# Patient Record
Sex: Female | Born: 1980 | Race: White | Hispanic: No | Marital: Married | Smoking: Former smoker
Health system: Southern US, Community
[De-identification: ages and names within clinical notes are randomized; demographics above are authoritative.]

## PROBLEM LIST (undated history)

## (undated) DIAGNOSIS — J45909 Unspecified asthma, uncomplicated: Secondary | ICD-10-CM

## (undated) DIAGNOSIS — F419 Anxiety disorder, unspecified: Secondary | ICD-10-CM

## (undated) DIAGNOSIS — K2100 Gastro-esophageal reflux disease with esophagitis, without bleeding: Secondary | ICD-10-CM

## (undated) DIAGNOSIS — Z973 Presence of spectacles and contact lenses: Secondary | ICD-10-CM

## (undated) DIAGNOSIS — K219 Gastro-esophageal reflux disease without esophagitis: Secondary | ICD-10-CM

## (undated) DIAGNOSIS — T753XXA Motion sickness, initial encounter: Secondary | ICD-10-CM

---

## 2013-02-25 DIAGNOSIS — G4700 Insomnia, unspecified: Secondary | ICD-10-CM | POA: Insufficient documentation

## 2013-02-25 DIAGNOSIS — J45909 Unspecified asthma, uncomplicated: Secondary | ICD-10-CM | POA: Insufficient documentation

## 2013-04-24 DIAGNOSIS — F419 Anxiety disorder, unspecified: Secondary | ICD-10-CM | POA: Insufficient documentation

## 2013-08-24 DIAGNOSIS — Z0000 Encounter for general adult medical examination without abnormal findings: Secondary | ICD-10-CM | POA: Insufficient documentation

## 2014-04-09 DIAGNOSIS — J309 Allergic rhinitis, unspecified: Secondary | ICD-10-CM | POA: Insufficient documentation

## 2016-05-10 DIAGNOSIS — J4540 Moderate persistent asthma, uncomplicated: Secondary | ICD-10-CM | POA: Insufficient documentation

## 2016-05-10 DIAGNOSIS — J4530 Mild persistent asthma, uncomplicated: Secondary | ICD-10-CM | POA: Insufficient documentation

## 2018-07-27 ENCOUNTER — Other Ambulatory Visit: Payer: Self-pay

## 2018-07-27 ENCOUNTER — Encounter: Payer: Self-pay | Admitting: Emergency Medicine

## 2018-07-27 ENCOUNTER — Ambulatory Visit
Admission: EM | Admit: 2018-07-27 | Discharge: 2018-07-27 | Disposition: A | Discharge status: Home / Self Care | Payer: Self-pay | Attending: Family Medicine | Admitting: Family Medicine

## 2018-07-27 DIAGNOSIS — J4521 Mild intermittent asthma with (acute) exacerbation: Secondary | ICD-10-CM

## 2018-07-27 HISTORY — DX: Gastro-esophageal reflux disease with esophagitis, without bleeding: K21.00

## 2018-07-27 HISTORY — DX: Gastro-esophageal reflux disease with esophagitis: K21.0

## 2018-07-27 HISTORY — DX: Unspecified asthma, uncomplicated: J45.909

## 2018-07-27 HISTORY — DX: Anxiety disorder, unspecified: F41.9

## 2018-07-27 MED ORDER — PREDNISONE 20 MG PO TABS: ORAL_TABLET | ORAL | 0 refills | Status: DC

## 2018-07-27 NOTE — Discharge Instructions (Addendum)
Take medication as prescribed. Drink plenty of fluids. Follow up with pulmonologist as discussed.   Follow up with your primary care physician this week as needed. Return to Urgent care for new or worsening concerns.

## 2018-07-27 NOTE — ED Provider Notes (Signed)
[facility] ____________________________________________  Time seen: Approximately 11:54 AM  I have reviewed the triage vital signs and the nursing notes.   HISTORY  Chief Complaint Asthma   HPI Susan Payne is a 36 y.o. female history of asthma, presenting for evaluation of asthma exacerbation present for last 2 days.  Patient reports she did have a recent cold, but reports she is almost over it and unsure if this is her trigger.  States may be a coincidence or a trigger that she is currently at the end of her Symbicort which may have also been a trigger last month, unsure if dose inhaled different at end.  Was treated 1 months ago with prednisone Dosepak by her pulmonologist for asthma exacerbation.  Patient reports since Wednesday having intermittent chest tightness as well as wheezing with shortness of breath consistent with her asthma.  Denies any chest pain.  States shortness of breath and wheezing same as previous asthma exacerbations.  States nasal congestion and cough has since resolved from recent cold symptoms.  No fevers.  No hemoptysis.  No recent travel.  Denies lower extremity swelling.  Denies current pregnancy.  Has continue with her albuterol inhaler and intermittent nebulizer treatments.  Denies other aggravating alleviating factors.  Reports otherwise feels well.   Patient's last menstrual period was 07/06/2018 (exact date). IUD. Denies pregnancy.     Past Medical History:  Diagnosis Date  . Anxiety   . Asthma   . Reflux esophagitis     There are no active problems to display for this patient.   Past Surgical History:  Procedure Laterality Date  . CESAREAN SECTION       No current facility-administered medications for this encounter.   Current Outpatient Medications:  .  albuterol (PROVENTIL) (2.5 MG/3ML) 0.083% nebulizer solution, Inhale into the lungs., Disp: , Rfl:  .  albuterol (VENTOLIN HFA) 108 (90 Base) MCG/ACT inhaler, INHALE 2  INHALATIONS INTO THE LUNGS EVERY 6 (SIX) HOURS AS NEEDED FOR WHEEZING., Disp: , Rfl:  .  budesonide-formoterol (SYMBICORT) 160-4.5 MCG/ACT inhaler, INHALE 2 INHALATIONS INTO THE LUNGS 2 (TWO) TIMES DAILY, Disp: , Rfl:  .  Cholecalciferol (VITAMIN D3) 5000 units CAPS, Take by mouth., Disp: , Rfl:  .  citalopram (CELEXA) 10 MG tablet, Take by mouth., Disp: , Rfl:  .  loratadine (CLARITIN) 10 MG tablet, Take by mouth., Disp: , Rfl:  .  LORazepam (ATIVAN) 0.5 MG tablet, Take by mouth., Disp: , Rfl:  .  montelukast (SINGULAIR) 10 MG tablet, TAKE 1 TABLET BY MOUTH EVERY DAY IN THE EVENING, Disp: , Rfl:  .  Nebulizers (VIOS LC SPRINT DELUXE) MISC, Use nebulizer q 4 hours as needed.  Please give with hand held mouth device.  Not mask, Disp: , Rfl:  .  omeprazole (PRILOSEC OTC) 20 MG tablet, Take by mouth., Disp: , Rfl:  .  predniSONE (DELTASONE) 20 MG tablet, Take 2 tablets 40mg  daily orally for 3 days, then 1 tablet 20 mg daily for 2 days., Disp: 8 tablet, Rfl: 0  Allergies Patient has no known allergies.  History reviewed. No pertinent family history.  Social History Social History   Tobacco Use  . Smoking status: Former Games developer  . Smokeless tobacco: Never Used  Substance Use Topics  . Alcohol use: Yes  . Drug use: Never    Review of Systems Constitutional: No fever ENT: No sore throat. Cardiovascular: Denies chest pain. Respiratory: As above.  Gastrointestinal: No abdominal pain. Skin: Negative for rash.  ____________________________________________  PHYSICAL EXAM:  VITAL SIGNS: ED Triage Vitals [07/27/18 1128]  Enc Vitals Group     BP      Pulse      Resp      Temp      Temp src      SpO2      Weight 115 lb (52.2 kg)     Height 5\' 4"  (1.626 m)     Head Circumference      Peak Flow      Pain Score 0     Pain Loc      Pain Edu?      Excl. in GC?    Vitals:   07/27/18 1128 07/27/18 1132  BP:  122/82  Pulse:  74  Resp:  18  Temp:  98.2 F (36.8 C)  TempSrc:   Oral  SpO2:  100%  Weight: 115 lb (52.2 kg)   Height: 5\' 4"  (1.626 m)      Constitutional: Alert and oriented. Well appearing and in no acute distress. Eyes: Conjunctivae are normal.  Head: Atraumatic. No sinus tenderness to palpation. No swelling. No erythema.  Ears: no erythema, normal TMs bilaterally.   Nose:No nasal congestion  Mouth/Throat: Mucous membranes are moist. No pharyngeal erythema. No tonsillar swelling or exudate.  Neck: No stridor.  No cervical spine tenderness to palpation. Hematological/Lymphatic/Immunilogical: No cervical lymphadenopathy. Cardiovascular: Normal rate, regular rhythm. Grossly normal heart sounds.  Good peripheral circulation. Respiratory: Normal respiratory effort.  No retractions.  No rhonchi or rales.  Minimal scattered bilateral wheezes noted.  Good air movement.  Speaks in complete sentences. Musculoskeletal: Ambulatory with steady gait.  No lower extremity edema noted bilaterally. Neurologic:  Normal speech and language. No gait instability. Skin:  Skin appears warm, dry and intact. No rash noted. Psychiatric: Mood and affect are normal. Speech and behavior are normal.  ___________________________________________   LABS (all labs ordered are listed, but only abnormal results are displayed)  Labs Reviewed - No data to display ____________________________________________   PROCEDURES Procedures   INITIAL IMPRESSION / ASSESSMENT AND PLAN / ED COURSE  Pertinent labs & imaging results that were available during my care of the patient were reviewed by me and considered in my medical decision making (see chart for details).  Well-appearing patient.  No acute distress.  Suspect mild asthma exacerbation.  Continue with current home treatments and will Rx 5-day course prednisone.  Encouraged follow-up with pulmonologist.  Encourage rest, fluids, supportive care.Discussed indication, risks and benefits of medications with patient.  Discussed  follow up with Primary care physician this week as needed. Discussed follow up and return parameters including no resolution or any worsening concerns. Patient verbalized understanding and agreed to plan.   ____________________________________________   FINAL CLINICAL IMPRESSION(S) / ED DIAGNOSES  Final diagnoses:  Exacerbation of intermittent asthma, unspecified asthma severity     ED Discharge Orders         Ordered    predniSONE (DELTASONE) 20 MG tablet     07/27/18 1158           Note: This dictation was prepared with Dragon dictation along with smaller phrase technology. Any transcriptional errors that result from this process are unintentional.         Renford Dills, NP 07/27/18 1216

## 2018-07-27 NOTE — ED Triage Notes (Signed)
Patient states she is having an asthma flare that started 2 days ago. Patient stated she is currently taking Singulair, Symbicort, Claritin and Albuterol nebulizer.

## 2018-10-26 ENCOUNTER — Encounter: Payer: Self-pay | Admitting: *Deleted

## 2018-12-29 ENCOUNTER — Ambulatory Visit
Admission: EM | Admit: 2018-12-29 | Discharge: 2018-12-29 | Disposition: A | Discharge status: Home / Self Care | Payer: BLUE CROSS/BLUE SHIELD | Attending: Emergency Medicine | Admitting: Emergency Medicine

## 2018-12-29 ENCOUNTER — Other Ambulatory Visit: Payer: Self-pay

## 2018-12-29 ENCOUNTER — Encounter: Payer: Self-pay | Admitting: Gynecology

## 2018-12-29 DIAGNOSIS — J45901 Unspecified asthma with (acute) exacerbation: Secondary | ICD-10-CM | POA: Diagnosis not present

## 2018-12-29 DIAGNOSIS — Z87891 Personal history of nicotine dependence: Secondary | ICD-10-CM

## 2018-12-29 MED ORDER — PREDNISONE 10 MG (21) PO TBPK: ORAL_TABLET | ORAL | 0 refills | Status: DC

## 2018-12-29 MED ORDER — IPRATROPIUM-ALBUTEROL 0.5-2.5 (3) MG/3ML IN SOLN
3.0000 mL | Freq: Once | RESPIRATORY_TRACT | Status: AC
  Administered 2018-12-29: 3 mL via RESPIRATORY_TRACT

## 2018-12-29 MED ORDER — AEROCHAMBER PLUS MISC: 2 refills | Status: DC

## 2018-12-29 MED ORDER — IPRATROPIUM BROMIDE 0.02 % IN SOLN: 0.5000 mg | Freq: Two times a day (BID) | RESPIRATORY_TRACT | 0 refills | Status: DC

## 2018-12-29 NOTE — Discharge Instructions (Addendum)
2 puffs from your albuterol inhaler or you may use an albuterol nebulizer every 4 hours for the next 2 days, then every 6 hours for 2 days after that, then as needed.  You can use the ipratropium twice a day with the albuterol nebulizer.  Finish the prednisone unless a healthcare provider tells you to stop.  Make sure you use a spacer when you use your inhalers.  Keep a log of your peak flows, measured 3 times twice a day, write this down.  The number should be going up.

## 2018-12-29 NOTE — ED Triage Notes (Signed)
Patient c/o asthma flare up x couple weeks ago. Per patient using her nebulizer but not helping.

## 2018-12-29 NOTE — ED Provider Notes (Signed)
HPI  SUBJECTIVE:  Susan Payne is a 38 y.o. female who presents with an asthma flare.  She reports wheezing, chest tightness, shortness of breath, dyspnea on exertion, waking up at night coughing and short of breath.  She states that this was triggered by exposure to dust while cleaning yesterday.  She also temporarily decreased her GERD medication which has made it worse.  She has since returned to her previous dosing PPI/H2.  States that her symptoms are identical to previous asthma flares.  She has been doing albuterol nebulizers every 4 hours and started herself on prednisone yesterday with improvement in her symptoms.  She has also started a probiotic, and is taking Mucinex.  Symptoms are worse with being around dust or her cats to which she is allergic.  She denies fevers, calf pain, swelling, hemoptysis, immobilization, surgery in the past 4 weeks, exogenous estrogen.  She denies recent URI/viral/flulike symptoms.  No chest pain.  She gave herself an albuterol nebulizer immediately prior to arrival.  She has a past medical history of asthma on Singulair and Symbicort and is compliant with this.  She also has history of GERD, anxiety.  Noted she is a former smoker.  No history of DVT, PE, cancer, diabetes, hypertension.  LMP: 2/18.  Denies the possibility of being pregnant.  PMD: Andrey Cota, FNP .  Pulmonology: None.   Past Medical History:  Diagnosis Date  . Anxiety   . Asthma   . Reflux esophagitis     Past Surgical History:  Procedure Laterality Date  . CESAREAN SECTION      History reviewed. No pertinent family history.  Social History   Tobacco Use  . Smoking status: Former Games developer  . Smokeless tobacco: Never Used  Substance Use Topics  . Alcohol use: Yes  . Drug use: Never    No current facility-administered medications for this encounter.   Current Outpatient Medications:  .  albuterol (PROVENTIL) (2.5 MG/3ML) 0.083% nebulizer solution, Inhale into the lungs.,  Disp: , Rfl:  .  budesonide-formoterol (SYMBICORT) 160-4.5 MCG/ACT inhaler, INHALE 2 INHALATIONS INTO THE LUNGS 2 (TWO) TIMES DAILY, Disp: , Rfl:  .  citalopram (CELEXA) 10 MG tablet, Take by mouth., Disp: , Rfl:  .  famotidine (PEPCID) 10 MG tablet, Take 20 mg by mouth 2 (two) times daily., Disp: , Rfl:  .  loratadine (CLARITIN) 10 MG tablet, Take by mouth., Disp: , Rfl:  .  LORazepam (ATIVAN) 0.5 MG tablet, Take by mouth., Disp: , Rfl:  .  omeprazole (PRILOSEC OTC) 20 MG tablet, Take by mouth., Disp: , Rfl:  .  albuterol (VENTOLIN HFA) 108 (90 Base) MCG/ACT inhaler, INHALE 2 INHALATIONS INTO THE LUNGS EVERY 6 (SIX) HOURS AS NEEDED FOR WHEEZING., Disp: , Rfl:  .  ipratropium (ATROVENT) 0.02 % nebulizer solution, Take 2.5 mLs (0.5 mg total) by nebulization 2 (two) times daily., Disp: 75 mL, Rfl: 0 .  montelukast (SINGULAIR) 10 MG tablet, TAKE 1 TABLET BY MOUTH EVERY DAY IN THE EVENING, Disp: , Rfl:  .  Nebulizers (VIOS LC SPRINT DELUXE) MISC, Use nebulizer q 4 hours as needed.  Please give with hand held mouth device.  Not mask, Disp: , Rfl:  .  predniSONE (STERAPRED UNI-PAK 21 TAB) 10 MG (21) TBPK tablet, Dispense 12 day pack. Take as directed with food., Disp: 42 tablet, Rfl: 0 .  Spacer/Aero-Holding Chambers (AEROCHAMBER PLUS) inhaler, Use as instructed, Disp: 1 each, Rfl: 2  No Known Allergies   ROS  As noted  in HPI.   Physical Exam  BP 122/85 (BP Location: Left Arm)   Pulse 80   Temp 98 F (36.7 C) (Oral)   Resp 16   Ht 5\' 4"  (1.626 m)   Wt 56.7 kg   LMP 12/08/2018   SpO2 99%   BMI 21.46 kg/m   Constitutional: Well developed, well nourished, no acute distress Eyes:  EOMI, conjunctiva normal bilaterally HENT: Normocephalic, atraumatic,mucus membranes moist Respiratory: Normal inspiratory effort, lungs clear bilaterally, fair air movement. Cardiovascular: Normal rate regular rhythm, no murmurs, rubs, gallops GI: nondistended skin: No rash, skin intact Musculoskeletal:  Calves symmetric, nontender, no palpable cord, no edema. Neurologic: Alert & oriented x 3, no focal neuro deficits Psychiatric: Speech and behavior appropriate   ED Course   Medications  ipratropium-albuterol (DUONEB) 0.5-2.5 (3) MG/3ML nebulizer solution 3 mL (3 mLs Nebulization Given 12/29/18 1617)    Orders Placed This Encounter  Procedures  . Peak flow    Pre and post tx    Standing Status:   Standing    Number of Occurrences:   1    Order Specific Question:   Pre and post Neb    Answer:   Yes    No results found for this or any previous visit (from the past 24 hour(s)). No results found.  ED Clinical Impression  Asthma with acute exacerbation, unspecified asthma severity, unspecified whether persistent   ED Assessment/Plan Presentation consistent with an asthma exacerbation.  Patient has been very aggressive with the albuterol and states that she is improving somewhat with the steroids however she needs a longer taper of steroids.  Her lungs are clear but she does have prolonged expiratory phase.  She is in no respiratory distress.  Will give her a DuoNeb and reevaluate.  She is PERC negative.  Calculated peak flow 427, patient thinks her best peak flow is 450.  preTreatment: 400/350/400 Posttreatment #1.  420/450/430.  She states that she feels significantly better.  Improved air movement.  Plan to send home with a 12-day prednisone taper as patient states that she does not respond to regular short-term tapers, will send her home with a spacer for her albuterol.  We will refill her ipratropium 0.02% solution twice daily.  We will have her keep a log of peak flows.  She will need to follow-up with [facility] pulmonology or Dr. Mike Craze, pulmonology on-call, for ongoing management.  Do not think that she needs a chest x-ray today.  Discussed MDM, treatment plan, and plan for follow-up with patient. Discussed sn/sx that should prompt return to the ED. patient agrees with  plan.   Meds ordered this encounter  Medications  . ipratropium-albuterol (DUONEB) 0.5-2.5 (3) MG/3ML nebulizer solution 3 mL  . ipratropium (ATROVENT) 0.02 % nebulizer solution    Sig: Take 2.5 mLs (0.5 mg total) by nebulization 2 (two) times daily.    Dispense:  75 mL    Refill:  0  . Spacer/Aero-Holding Chambers (AEROCHAMBER PLUS) inhaler    Sig: Use as instructed    Dispense:  1 each    Refill:  2  . predniSONE (STERAPRED UNI-PAK 21 TAB) 10 MG (21) TBPK tablet    Sig: Dispense 12 day pack. Take as directed with food.    Dispense:  42 tablet    Refill:  0    *This clinic note was created using Scientist, clinical (histocompatibility and immunogenetics). Therefore, there may be occasional mistakes despite careful proofreading.   ?   Domenick Gong, MD 12/29/18 1706

## 2019-01-02 ENCOUNTER — Ambulatory Visit: Payer: Self-pay | Admitting: Gastroenterology

## 2019-02-13 ENCOUNTER — Ambulatory Visit
Admission: RE | Admit: 2019-02-13 | Discharge: 2019-02-13 | Disposition: A | Discharge status: Home / Self Care | Payer: Managed Care, Other (non HMO) | Source: Ambulatory Visit | Attending: Allergy | Admitting: Allergy

## 2019-02-13 ENCOUNTER — Other Ambulatory Visit: Payer: Self-pay | Admitting: Allergy

## 2019-02-13 DIAGNOSIS — J4550 Severe persistent asthma, uncomplicated: Secondary | ICD-10-CM | POA: Diagnosis present

## 2019-05-08 ENCOUNTER — Telehealth: Payer: Self-pay

## 2019-05-08 NOTE — Telephone Encounter (Signed)
Called pt x1 and left vm for her to call back and schedule consult appt. Received faxed referral for asthma.

## 2019-05-20 NOTE — Telephone Encounter (Signed)
Called x2 and left vm for pt to call back and schedule appt.

## 2019-06-20 ENCOUNTER — Ambulatory Visit: Payer: Managed Care, Other (non HMO) | Admitting: Pulmonary Disease

## 2019-06-20 ENCOUNTER — Other Ambulatory Visit
Admission: RE | Admit: 2019-06-20 | Discharge: 2019-06-20 | Disposition: A | Discharge status: Home / Self Care | Payer: Managed Care, Other (non HMO) | Source: Ambulatory Visit | Attending: Pulmonary Disease | Admitting: Pulmonary Disease

## 2019-06-20 ENCOUNTER — Encounter: Payer: Self-pay | Admitting: Pulmonary Disease

## 2019-06-20 ENCOUNTER — Other Ambulatory Visit: Payer: Self-pay

## 2019-06-20 VITALS — BP 140/86 | HR 111 | Temp 98.3°F | Ht 64.0 in | Wt 139.0 lb

## 2019-06-20 DIAGNOSIS — R0602 Shortness of breath: Secondary | ICD-10-CM | POA: Insufficient documentation

## 2019-06-20 DIAGNOSIS — K219 Gastro-esophageal reflux disease without esophagitis: Secondary | ICD-10-CM

## 2019-06-20 DIAGNOSIS — Z7952 Long term (current) use of systemic steroids: Secondary | ICD-10-CM

## 2019-06-20 DIAGNOSIS — J45909 Unspecified asthma, uncomplicated: Secondary | ICD-10-CM

## 2019-06-20 LAB — COMPREHENSIVE METABOLIC PANEL
ALT: 49 U/L — ABNORMAL HIGH (ref 0–44)
AST: 27 U/L (ref 15–41)
Albumin: 4.4 g/dL (ref 3.5–5.0)
Alkaline Phosphatase: 51 U/L (ref 38–126)
Anion gap: 10 (ref 5–15)
BUN: 18 mg/dL (ref 6–20)
CO2: 25 mmol/L (ref 22–32)
Calcium: 9.3 mg/dL (ref 8.9–10.3)
Chloride: 104 mmol/L (ref 98–111)
Creatinine, Ser: 0.73 mg/dL (ref 0.44–1.00)
GFR calc Af Amer: >60 mL/min (ref >60)
GFR calc non Af Amer: >60 mL/min (ref >60)
Glucose, Bld: 126 mg/dL — ABNORMAL HIGH (ref 70–99)
Potassium: 4.1 mmol/L (ref 3.5–5.1)
Sodium: 139 mmol/L (ref 135–145)
Total Bilirubin: 0.7 mg/dL (ref 0.3–1.2)
Total Protein: 7.1 g/dL (ref 6.5–8.1)

## 2019-06-20 MED ORDER — SPIRIVA RESPIMAT 2.5 MCG/ACT IN AERS: 2.0000 | INHALATION_SPRAY | Freq: Every day | RESPIRATORY_TRACT | 6 refills | Status: DC

## 2019-06-20 NOTE — Progress Notes (Signed)
Subjective:    Patient ID: Susan Payne, female    DOB: 1981-08-16, 38 y.o.   MRN: [id number]  HPI Susan Payne is a 38 year old remote former smoker (quit 2012) who presents for a second opinion with regards to poorly controlled asthma.  She is kindly referred by Malachy Moan FNP.  The patient has been having issues with asthma for approximately 19 years.  When first diagnosed she was still smoking 1 pack of cigarettes per day she smoked for a total of 18 years.  She quit in 2012.  Up until last fall she had been doing well with her inhalers and as needed nebulizers and prednisone 2.5 mg twice a day.  In the fall 2019 she developing worsening symptoms requiring frequent increased tapers of prednisone.  On March 15 this year she had a flareup and since then has not been able to taper her prednisone to more than 35 mg/day.  She is currently taking anywhere between 35 to 40 mg/day.  She has noted increased weight and developing of moon facies with this.  She had been following at [facility] and she was started on Xolair in June.  Her initial dose was relatively low and she has recently increased dose to appropriate for IgE level and weight on her fourth dose of Xolair.  She has not noted significant improvement yet.  She notices her wheezing mostly in the upper airway.  She has been noted to have mild obstruction on her PFTs that were performed last the [facility] however the flow volume loop is not available for my review and it is described as "delayed".  She has had allergy evaluation that has set that she is sensitive to dogs cats and dust mites.  She has noted that she had issues with mold in the crawl space of her home and has had a mold remediation company clear these issues for her.  She notes that when she starts decreasing prednisone dose she becomes fatigued and perceives that she wheezes more.  She describes dyspnea with minimal exertion that does not respond to her nebulizers or inhalers.   She does not describe any fevers, chills or sweats.  Today she states that it is a "so-so day for her.  There have been variations of control with pregnancy and also during menses.  Her current regimen includes Symbicort 160/4.5, 2 inhalations twice a day.  Singulair 10 mg daily, DuoNeb as needed via nebulizer she is on Spiriva 1.25 mcg 2 inhalations once daily.  Studies so far include a chest x-ray performed on 13 February 2019 that, on my review, shows  some hyperinflation.  Her past medical history, past surgical history and family history has been reviewed.  They are as noted.  She is married lives with her husband has 1 child she works as an English as a second language teacher.  She is currently working out of the home.  She is observing COVID-19 social distancing and masking measures.  She has 2 dogs indoors and 2 outdoor cats.  She resided in [place] until 2010 when she moved to this area.  Review of Systems  Constitutional: Positive for unexpected weight change (Weight gain related to steroids).  HENT: Positive for postnasal drip and sinus pressure.   Eyes: Negative.   Respiratory: Positive for chest tightness, shortness of breath and wheezing.   Cardiovascular: Positive for palpitations (Related to anxiety).  Gastrointestinal:       Reflux symptoms, controlled with PPI  Endocrine: Negative.   Genitourinary: Negative.  Musculoskeletal: Negative.   Skin: Negative.   Allergic/Immunologic: Positive for environmental allergies.  Neurological: Negative.   Hematological: Bruises/bleeds easily (Easy bruising associated with prednisone).  Psychiatric/Behavioral: The patient is nervous/anxious.   All other systems reviewed and are negative.         Objective:   Physical Exam Vitals signs and nursing note reviewed.  Constitutional:      General: She is not in acute distress.    Appearance: Normal appearance. She is normal weight.  HENT:     Head: Normocephalic and atraumatic.     Comments: Moon  facies    Right Ear: External ear normal.     Left Ear: External ear normal.     Nose:     Comments: Nose/mouth/throat not examined due to masking requirements for COVID 19. Eyes:     General: No scleral icterus.    Conjunctiva/sclera: Conjunctivae normal.     Pupils: Pupils are equal, round, and reactive to light.  Neck:     Musculoskeletal: Neck supple.     Thyroid: No thyromegaly.     Trachea: Trachea and phonation normal.  Cardiovascular:     Rate and Rhythm: Normal rate and regular rhythm.     Pulses: Normal pulses.     Heart sounds: Normal heart sounds.  Pulmonary:     Effort: Pulmonary effort is normal.     Breath sounds: No wheezing or rhonchi.     Comments: Coarse breath sounds with slight delay in expiratory phase.  No active wheezing. Abdominal:     General: There is no distension.     Palpations: Abdomen is soft.  Musculoskeletal: Normal range of motion.     Right lower leg: No edema.     Left lower leg: No edema.  Lymphadenopathy:     Cervical: No cervical adenopathy.  Skin:    General: Skin is warm and dry.     Findings: Bruising present.  Neurological:     General: No focal deficit present.     Mental Status: She is alert and oriented to person, place, and time.  Psychiatric:        Mood and Affect: Mood normal.        Behavior: Behavior normal.     Chest x-ray performed in April 2020: Hyperinflation noted, no overt infiltrates.      Assessment & Plan:   1.  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  She recently was started on Xolair but just has had 1 injection at the appropriate dose.  It may be that this may take a few more doses to be having noticeable effects.  On the other hand she may need a change to a different biologic.  We will review IgE and RAST.  If Xolair is working appropriately one would expect elevated total IgE with decreased free IgE.  Pending these results she may also  need evaluation for potential ABPA.  She will need slow taper of her prednisone given that she has been on higher doses for several months now.  We will repeat PFTs to reassess her respiratory status.  Will also obtain alpha-1 antitrypsin to exclude potential hereditary forms of emphysema that may be mimicking asthma.   2.  Dyspnea/shortness of breath: Her persistent dyspnea is of concern.  She has significant hyperinflation noted on chest x-ray performed in April.  We will increase Spiriva to 2.5 mcg 2 puffs daily to see if this helps with  air trapping.  The concern could be also different pulmonary disease etiology other than asthma.  To further evaluate her dyspnea will obtain a CT scan of the chest to evaluate for potential chronic thromboembolic disease, also obtain 2D echo to exclude potential cardiac etiologies of dyspnea.   3.  Gastroesophageal reflux disease: She was reminded of antireflux measures, continue omeprazole OTC.  We will see the patient in 3 to 4 weeks time.  She is to contact us prior to that time should any new difficulties arise.  Thank you for allowing me to participate in this patient's care  This chart was dictated using voice recognition software/Dragon.  Despite best efforts to proofread, errors can occur which can change the meaning.  Any change was purely unintentional.

## 2019-06-20 NOTE — Patient Instructions (Addendum)
1.  We will schedule blood tests, breathing test, heart test and chest CT for evaluation of your issues.  2.  Were checking an alpha-1 antitrypsin.  3.  Try to decrease your prednisone by 1 mg daily every week.  4.  I have increased your Spiriva to the 2.5 mcg size  2 puffs once daily  5.  We will see you in follow-up in 2 to 3 weeks time.

## 2019-06-22 ENCOUNTER — Ambulatory Visit (INDEPENDENT_AMBULATORY_CARE_PROVIDER_SITE_OTHER)
Admission: RE | Admit: 2019-06-22 | Discharge: 2019-06-22 | Disposition: A | Discharge status: Home / Self Care | Payer: Managed Care, Other (non HMO) | Source: Ambulatory Visit

## 2019-06-22 DIAGNOSIS — Z760 Encounter for issue of repeat prescription: Secondary | ICD-10-CM

## 2019-06-22 DIAGNOSIS — J4550 Severe persistent asthma, uncomplicated: Secondary | ICD-10-CM | POA: Diagnosis not present

## 2019-06-22 LAB — ALLERGENS W/TOTAL IGE AREA 2
Alternaria Alternata IgE: <0.1 kU/L
Aspergillus Fumigatus IgE: <0.1 kU/L
Bermuda Grass IgE: <0.1 kU/L
Cat Dander IgE: 1.07 kU/L — AB
Cedar, Mountain IgE: 0.28 kU/L — AB
Cladosporium Herbarum IgE: <0.1 kU/L
Cockroach, German IgE: <0.1 kU/L
Common Silver Birch IgE: <0.1 kU/L
Cottonwood IgE: <0.1 kU/L
D Farinae IgE: <0.1 kU/L
D Pteronyssinus IgE: <0.1 kU/L
Dog Dander IgE: 0.34 kU/L — AB
Elm, American IgE: <0.1 kU/L
IgE (Immunoglobulin E), Serum: 385 IU/mL (ref 6–495)
Johnson Grass IgE: <0.1 kU/L
Maple/Box Elder IgE: 0.11 kU/L — AB
Mouse Urine IgE: <0.1 kU/L
Oak, White IgE: <0.1 kU/L
Pecan, Hickory IgE: <0.1 kU/L
Penicillium Chrysogen IgE: <0.1 kU/L
Pigweed, Rough IgE: <0.1 kU/L
Ragweed, Short IgE: <0.1 kU/L
Sheep Sorrel IgE Qn: <0.1 kU/L
Timothy Grass IgE: <0.1 kU/L
White Mulberry IgE: <0.1 kU/L

## 2019-06-22 MED ORDER — PREDNISONE 10 MG PO TABS: 40.0000 mg | ORAL_TABLET | Freq: Every day | ORAL | 0 refills | Status: AC

## 2019-06-22 NOTE — Discharge Instructions (Addendum)
Prednisone refilled

## 2019-06-22 NOTE — ED Provider Notes (Signed)
Virtual Visit via Video Note:  Susan Payne  initiated request for Telemedicine visit with [facility] team. I connected with Susan Payne  on 06/22/2019 at 4:29 PM  for a synchronized telemedicine visit using a video enabled HIPPA compliant telemedicine application. I verified that I am speaking with Susan Payne  using two identifiers. Hallie C Wieters, PA-C  was physically located in a [facility] site and Susan Payne was located at a different location.   The limitations of evaluation and management by telemedicine as well as the availability of in-person appointments were discussed. Patient was informed that she  may incur a bill ( including co-pay) for this virtual visit encounter. Susan Payne  expressed understanding and gave verbal consent to proceed with virtual visit.     History of Present Illness:Susan Payne  is a 38 y.o. female presents for evaluation for medication refill.  States that she has resistant asthma and is currently working with 2 pulmonologist in order to control her asthma.  Since March she has been on daily prednisone which has only been the only thing that has stabilized her asthma.  She is currently taking 30 to 40 mg daily.  She is concerned as with the holiday weekend she will run out of her prednisone and her pulmonologist are not open.  She recently started taking Xolair and the hope is to taper off the prednisone and become controlled with Biologics.  She denies worsening or flaring of asthma.  Denies cough, fever or shortness of breath.      Allergies  Allergen Reactions  . Dust Mite Extract Itching and Other (See Comments)    Triggers asthma  Highly allergic to cats and dogs     Past Medical History:  Diagnosis Date  . Anxiety   . Asthma   . Reflux esophagitis      Social History   Tobacco Use  . Smoking status: Former Smoker    Packs/day: 1.00    Years: 18.00    Pack years: 18.00    Types: Cigarettes   Quit date: 2012    Years since quitting: 8.6  . Smokeless tobacco: Never Used  Substance Use Topics  . Alcohol use: Yes  . Drug use: Never        Observations/Objective: Physical Exam  Constitutional: She is oriented to person, place, and time and well-developed, well-nourished, and in no distress. No distress.  HENT:  Head: Normocephalic and atraumatic.  Neck:  Moving neck appropriately  Pulmonary/Chest: Effort normal. No respiratory distress.  Speaking in full sentences, no respiratory distress, no coughing during visit  Neurological: She is alert and oriented to person, place, and time.  Speech clear, face symmetric  Nursing note and vitals reviewed.    Assessment and Plan:    ICD-10-CM   1. Severe persistent asthma without complication  J45.50     Patient with persistent asthma.  Will provide prednisone to hold over until next week when she can follow-up with pulmonology.  Provided 20 tablets of 10 mg.  Continue to take as prescribed by pulmonology.Discussed strict return precautions. Patient verbalized understanding and is agreeable with plan.    Follow Up Instructions:     I discussed the assessment and treatment plan with the patient. The patient was provided an opportunity to ask questions and all were answered. The patient agreed with the plan and demonstrated an understanding of the instructions.   The patient was advised to call back or seek an in-person  evaluation if the symptoms worsen or if the condition fails to improve as anticipated.      Janith Lima, PA-C  06/22/2019 4:29 PM         Debara Pickett C, PA-C 06/22/19 1630

## 2019-06-25 ENCOUNTER — Encounter: Payer: Self-pay | Admitting: Pulmonary Disease

## 2019-06-25 DIAGNOSIS — F32A Depression, unspecified: Secondary | ICD-10-CM | POA: Insufficient documentation

## 2019-06-25 DIAGNOSIS — K219 Gastro-esophageal reflux disease without esophagitis: Secondary | ICD-10-CM | POA: Insufficient documentation

## 2019-06-25 DIAGNOSIS — F329 Major depressive disorder, single episode, unspecified: Secondary | ICD-10-CM | POA: Insufficient documentation

## 2019-07-02 ENCOUNTER — Ambulatory Visit: Payer: Managed Care, Other (non HMO)

## 2019-07-02 ENCOUNTER — Ambulatory Visit: Payer: Managed Care, Other (non HMO) | Admitting: Gastroenterology

## 2019-07-05 ENCOUNTER — Ambulatory Visit
Admission: RE | Admit: 2019-07-05 | Discharge: 2019-07-05 | Disposition: A | Discharge status: Home / Self Care | Payer: Managed Care, Other (non HMO) | Source: Ambulatory Visit | Attending: Pulmonary Disease | Admitting: Pulmonary Disease

## 2019-07-05 ENCOUNTER — Other Ambulatory Visit: Payer: Self-pay

## 2019-07-05 DIAGNOSIS — I081 Rheumatic disorders of both mitral and tricuspid valves: Secondary | ICD-10-CM | POA: Insufficient documentation

## 2019-07-05 DIAGNOSIS — R0602 Shortness of breath: Secondary | ICD-10-CM | POA: Diagnosis present

## 2019-07-05 DIAGNOSIS — F419 Anxiety disorder, unspecified: Secondary | ICD-10-CM | POA: Diagnosis not present

## 2019-07-05 NOTE — Progress Notes (Signed)
*  PRELIMINARY RESULTS* Echocardiogram 2D Echocardiogram has been performed.  Sherrie Sport 07/05/2019, 12:08 PM

## 2019-07-10 ENCOUNTER — Encounter: Payer: Self-pay | Admitting: Pulmonary Disease

## 2019-07-10 ENCOUNTER — Ambulatory Visit: Payer: Managed Care, Other (non HMO) | Admitting: Pulmonary Disease

## 2019-07-10 ENCOUNTER — Other Ambulatory Visit: Payer: Self-pay

## 2019-07-10 VITALS — BP 140/88 | HR 95 | Temp 98.5°F | Ht 64.0 in | Wt 141.8 lb

## 2019-07-10 DIAGNOSIS — J45909 Unspecified asthma, uncomplicated: Secondary | ICD-10-CM | POA: Diagnosis not present

## 2019-07-10 DIAGNOSIS — Z7952 Long term (current) use of systemic steroids: Secondary | ICD-10-CM | POA: Diagnosis not present

## 2019-07-10 DIAGNOSIS — R0602 Shortness of breath: Secondary | ICD-10-CM

## 2019-07-10 DIAGNOSIS — K219 Gastro-esophageal reflux disease without esophagitis: Secondary | ICD-10-CM | POA: Diagnosis not present

## 2019-07-10 MED ORDER — PREDNISONE 10 MG PO TABS: 10.0000 mg | ORAL_TABLET | Freq: Every day | ORAL | 3 refills | Status: DC

## 2019-07-10 MED ORDER — PREDNISONE 1 MG PO TABS: ORAL_TABLET | ORAL | 2 refills | Status: DC

## 2019-07-10 MED ORDER — ZAFIRLUKAST 20 MG PO TABS: 20.0000 mg | ORAL_TABLET | Freq: Two times a day (BID) | ORAL | 3 refills | Status: DC

## 2019-07-10 NOTE — Patient Instructions (Signed)
1.  We switched your montelukast to Accolate 20 mg twice a day  2.  Continue decreasing prednisone by 1 mg every week you are currently at 38 mg/day for the next decrease you should be at 37 mg/day  3.  Your Xolair dose is correct, and has been verified  4.  We will start process to switch to to the [facility] office for [place].  5.  We will see you in follow-up after your breathing test.

## 2019-07-11 NOTE — Progress Notes (Signed)
Subjective:    Patient ID: Susan Payne, female    DOB: Sep 30, 1981, 38 y.o.   MRN: [id number]  HPI Susan Payne is a 37 year old remote former smoker (quit 2012) who presents for a follow-up on a second opinion with regards to poorly controlled asthma. The patient has been having issues with asthma for approximately 19 years.  When first diagnosed she was still smoking 1 pack of cigarettes per day she smoked for a total of 18 years.  She quit in 2012.  Up until last fall she had been doing well with her inhalers and as needed nebulizers and prednisone 2.5 mg twice a day.  In the fall 2019 she developing worsening symptoms requiring frequent increased tapers of prednisone.  On March 15 this year she had a flareup and since then has not been able to taper her prednisone to less than 35 mg/day.  Currently she is on 38 mg/day. She has noted increased weight and developing of moon facies with this.  She had been following at [facility] and she was started on Xolair in June.  Her initial dose was relatively low and she has recently increased dose to appropriate for IgE level and weight on her fourth dose of Xolair.  She is to have a Xolair injection today.  She believes that she may be starting to see some mild improvement on her asthma symptoms with this. She notices her wheezing mostly in the upper airway.  She notes that when she starts decreasing prednisone dose she becomes fatigued and perceives that she wheezes more.  She describes dyspnea with minimal exertion that does not respond to her nebulizers or inhalers.  She does not describe any fevers, chills or sweats.  She does admit that when she decreases prednisone she has now realized that most of what gets her to increase the dose again is not somewhat shortness of breath but anxiety.  She will have to decrease prednisone very slowly by 1 mg at a time.  Since her initial visit she has not had any flareups.  She has decreased her prednisone to as  noted above 38 mg daily.  Her allergy panel showed that she had a total IgE of 385 international units/mL (odd since she is on Xolair and should have free IgE measured).  She is highly allergic to cat dander, somewhat allergic to dog dander and allergic to cedar and maple/[place].  She states that Dr. [place] Callas that [name] has told her she would qualify for "allergy shots" however, she would have to be off of prednisone.  She has recently been evaluated by GI and has been switched to Dexilant.  She notes improvement of her gastroesophageal reflux on this medication.  During her initial visit we increase Spiriva to 2.5 mcg 2 puffs daily, she feels that this is also helping her.  She voices no other complaints.  She was debating whether to continue follow-up with [facility] or transfer her care here I have advised her to think it through however she cannot see to pulmonologist at the same time.      Review of Systems  Constitutional: Positive for unexpected weight change (Weight gain related to steroids).  HENT: Positive for postnasal drip.   Eyes: Negative.   Respiratory: Positive for shortness of breath and wheezing.   Cardiovascular: Palpitations: Related to anxiety.  Gastrointestinal:       Reflux symptoms, controlled with PPI  Endocrine: Negative.   Genitourinary: Negative.   Musculoskeletal: Negative.  Skin: Negative.   Allergic/Immunologic: Positive for environmental allergies.  Neurological: Negative.   Hematological: Bruises/bleeds easily (Easy bruising associated with prednisone).  Psychiatric/Behavioral: The patient is nervous/anxious.   All other systems reviewed and are negative.      Objective:   Physical Exam Vitals signs and nursing note reviewed.  Constitutional:      General: She is not in acute distress.    Appearance: Normal appearance. She is normal weight.  HENT:     Head: Normocephalic and atraumatic.     Comments: Moon facies    Right  Ear: External ear normal.     Left Ear: External ear normal.     Nose:     Comments: Nose/mouth/throat not examined due to masking requirements for COVID 19. Eyes:     General: No scleral icterus.    Conjunctiva/sclera: Conjunctivae normal.     Pupils: Pupils are equal, round, and reactive to light.  Neck:     Musculoskeletal: Neck supple.     Thyroid: No thyromegaly.     Trachea: Trachea and phonation normal.  Cardiovascular:     Rate and Rhythm: Normal rate and regular rhythm.     Pulses: Normal pulses.     Heart sounds: Normal heart sounds.  Pulmonary:     Effort: Pulmonary effort is normal.     Breath sounds: No wheezing or rhonchi.     Comments: No active wheezing. Abdominal:     General: There is no distension.     Palpations: Abdomen is soft.  Musculoskeletal: Normal range of motion.     Right lower leg: No edema.     Left lower leg: No edema.  Lymphadenopathy:     Cervical: No cervical adenopathy.  Skin:    General: Skin is warm and dry.     Findings: Bruising present.  Neurological:     General: No focal deficit present.     Mental Status: She is alert and oriented to person, place, and time.  Psychiatric:        Mood and Affect: Mood normal.        Behavior: Behavior normal.   PFTs are still pending.  Echocardiogram was normal this was performed on 05 July 2019 results were discussed with the patient.        Assessment & Plan:   1.  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  She recently was started on Xolair she will have her third injection on the appropriate dose today.  It may be that this may take a few more doses to be having noticeable effects.  On the other hand she may need a change to a different biologic.  We will review IgE and RAST.  If Xolair is working appropriately one would expect elevated total IgE with decreased free IgE.  Her IgE did not show any change from prior.   Depending on whom she chooses to follow-up with she should be worked up for ABPA.  The patient is going to sort these issues out. She will need slow taper of her prednisone given that she has been on higher doses for several months now.    If these are still pending. Alpha-1 antitrypsin is pending.  We will switch montelukast to Accolate 20 mg twice a day as she has not noticed any improvement with montelukast.    2.  Dyspnea/shortness of breath: Her persistent dyspnea is of concern.  She has significant hyperinflation noted  on chest x-ray performed in April.  She had an increase of Spiriva to 2.5 mcg 2 puffs daily during her last visit and feels that she may be improving somewhat with this. The concern could be also different pulmonary disease etiology other than asthma.  Her insurance company did not approve a CT scan of the chest however I still recommend that this be done.  Patient will determine who she will follow with and we will then set up for CT at that point.  Echocardiogram was normal.  3.  Gastroesophageal reflux disease: She was reminded of antireflux measures, recently was evaluated by GI and switch to Dexilant 60 mg daily.  She notes improvement on her gastroesophageal reflux symptoms on this.  We will see the patient after her PFTs if she wishes to continue follow-up here.  She is to contact us prior to that time should any new difficulties arise.   This chart was dictated using voice recognition software/Dragon.  Despite best efforts to proofread, errors can occur which can change the meaning.  Any change was purely unintentional.

## 2019-07-18 ENCOUNTER — Telehealth: Payer: Self-pay | Admitting: Pulmonary Disease

## 2019-07-18 NOTE — Telephone Encounter (Signed)
Received faxed enrollment forms from our [place] office. These have been refaxed back to [place]. Will await summary of benefits.

## 2019-07-24 ENCOUNTER — Telehealth: Payer: Self-pay | Admitting: Pulmonary Disease

## 2019-07-24 MED ORDER — XOLAIR 150 MG/ML ~~LOC~~ SOSY: 300.0000 mg | PREFILLED_SYRINGE | SUBCUTANEOUS | 12 refills | Status: DC

## 2019-07-24 NOTE — Telephone Encounter (Signed)
Contacted [employer] to follow up on pt's summary of benefits and enrollment status. Was advised that they did not have the pt's enrollment form in their system. Will refax these forms. Will continue to follow up.

## 2019-07-24 NOTE — Telephone Encounter (Signed)
Spoke with pt. She was scheduled for a Xolair injection on 07/25/2019. Advised her that we are still in process of getting her medication approved. She states that she has already been approved and this will be a continuing treatment. Her former pulmonologist started her on this medication and Dr. Patsey Berthold is taking it over. Pt is due for an injection tomorrow, she is going to contact her other doctor to see if they will give her one last injection so that she does not miss a dose. Reports that she is getting her medication from [place]. Pt is going to try to see if she can locate a copy of the approval letter from her insurance from her former physicians office that way we will have that on file. Rx for Xolair 300mg  q2w has been sent to [facility] under Dr. Patsey Berthold. Will continue to follow up with pt.

## 2019-07-24 NOTE — Telephone Encounter (Signed)
Please telephone encounter 07/18/2019.

## 2019-07-25 ENCOUNTER — Ambulatory Visit: Payer: Managed Care, Other (non HMO)

## 2019-07-26 NOTE — Telephone Encounter (Signed)
Called [employer] to see if they received the pt's enrollment form. They did receive the enrollment form but the office's tax ID number was missing so they could not do a summary of benefits. This information has been given to them. They will now run a summary of benefits with the pt's insurance. Will continue to follow up.

## 2019-07-30 ENCOUNTER — Ambulatory Visit: Payer: Managed Care, Other (non HMO) | Admitting: Gastroenterology

## 2019-07-30 ENCOUNTER — Encounter

## 2019-08-01 NOTE — Telephone Encounter (Signed)
Fax received from [place].  Patient has Xolair 150 mg PA approval from 03/13/19 until 09/13/19, PA # [id number]. PA is required for Patient to continue Xolair injections.Called [phone], to initiate new xolair 300mg  PA.  Xolair PA is approved for 300mg  every 2 weeks, starting with her next dose until 09/13/19. New Xolair PA can not be started until current PA is closer to end date.

## 2019-08-02 NOTE — Telephone Encounter (Signed)
Received call from Coralyn Mark, with [place], requesting to know if Patient would be using Specialty Pharmacy or buy and bill. Patient will be using Specialty [facility], [facility]. Xolair 300mg  PA is good through 09/13/19. Will follow next week to set up shipment.

## 2019-08-06 ENCOUNTER — Telehealth: Payer: Self-pay | Admitting: Pulmonary Disease

## 2019-08-06 ENCOUNTER — Ambulatory Visit: Payer: Managed Care, Other (non HMO) | Admitting: Gastroenterology

## 2019-08-06 MED ORDER — PREDNISONE 10 MG PO TABS: ORAL_TABLET | ORAL | 2 refills | Status: DC

## 2019-08-06 NOTE — Telephone Encounter (Signed)
Spoke to pt, who stated that she has ran out prednisone and the pharmacy will not refill Rx, as it was written for 10mg  daily.  Pt stated that she is currently taking 35mg  daily and is tapering down.   Rx has been resent for 3 tablets daily per LG verbally.  Pt is aware and voiced her understanding. Nothing further is needed.

## 2019-08-07 NOTE — Telephone Encounter (Signed)
Called [facility] to set up shipment. Was advised that pt's medication could not be refilled/set up for shipment until 08/11/2019. Will call back next week to set up shipment.

## 2019-08-09 ENCOUNTER — Telehealth: Payer: Self-pay | Admitting: Pulmonary Disease

## 2019-08-09 ENCOUNTER — Other Ambulatory Visit: Payer: Self-pay

## 2019-08-09 NOTE — Telephone Encounter (Signed)
Pt is aware of date/time of covid.  

## 2019-08-09 NOTE — Telephone Encounter (Addendum)
Normal alpha-1 per Dr. Patsey Berthold.  Left message for pt.

## 2019-08-12 NOTE — Telephone Encounter (Signed)
LMTCB

## 2019-08-13 ENCOUNTER — Other Ambulatory Visit
Admission: RE | Admit: 2019-08-13 | Discharge: 2019-08-13 | Disposition: A | Discharge status: Home / Self Care | Payer: Managed Care, Other (non HMO) | Source: Ambulatory Visit | Attending: Pulmonary Disease | Admitting: Pulmonary Disease

## 2019-08-13 DIAGNOSIS — Z01812 Encounter for preprocedural laboratory examination: Secondary | ICD-10-CM | POA: Insufficient documentation

## 2019-08-13 DIAGNOSIS — Z20828 Contact with and (suspected) exposure to other viral communicable diseases: Secondary | ICD-10-CM | POA: Insufficient documentation

## 2019-08-13 LAB — SARS CORONAVIRUS 2 (TAT 6-24 HRS): SARS Coronavirus 2: NEGATIVE

## 2019-08-13 NOTE — Telephone Encounter (Signed)
Xolair Prefilled Syringe Order: 150mg  Prefilled Syringe:  #4 75mg  Prefilled Syringe: N/A Ordered Date: 08/13/2019 Expected date of arrival: 08/16/2019 Ordered by: Desmond Dike, [place]  Specialty Pharmacy: [facility]

## 2019-08-14 ENCOUNTER — Other Ambulatory Visit: Payer: Self-pay

## 2019-08-14 ENCOUNTER — Ambulatory Visit: Payer: Managed Care, Other (non HMO) | Attending: Pulmonary Disease

## 2019-08-14 DIAGNOSIS — R0602 Shortness of breath: Secondary | ICD-10-CM | POA: Insufficient documentation

## 2019-08-14 MED ORDER — ALBUTEROL SULFATE (2.5 MG/3ML) 0.083% IN NEBU
2.5000 mg | INHALATION_SOLUTION | Freq: Once | RESPIRATORY_TRACT | Status: AC
  Administered 2019-08-14: 2.5 mg via RESPIRATORY_TRACT
  Filled 2019-08-14: qty 3

## 2019-08-14 NOTE — Telephone Encounter (Signed)
Call made to patient, confirmed DOB, made aware of Alpha 1 results. Voiced understanding. Nothing further needed at this time.

## 2019-08-19 NOTE — Telephone Encounter (Signed)
Called [facility] to follow up on the pt's shipment as we did not receive it on 08/16/2019. Spoke with Bonnita Nasuti. Was advised that they have not been able to get in touch with the pt about the balance she has on file with them. Shipment can't be sent until the balance is taken care of.  Spoke with the pt. She states that she has already taken care of this balance. Pt is going to call [facility] to see what the issue is. Pt will have [facility] reach out to [place] to set up shipment. While speaking to the pt, she wanted to go ahead and set up her appointment. Pt has been scheduled for her injection on 08/22/2019 at 1430. Per Dr. Mortimer Fries (Dr. Patsey Berthold is unavailable), pt will have to wait 2 hours after getting the injection as this is the first time getting the injection with our office.

## 2019-08-20 NOTE — Telephone Encounter (Signed)
Called [facility] to follow up to make sure that the pt's shipment would be here by 08/22/2019. Spoke with [place]. Was advised that the pt set up shipment to be delivered to [place] on 08/21/2019. Will await shipment.

## 2019-08-21 ENCOUNTER — Encounter: Payer: Self-pay | Admitting: Pulmonary Disease

## 2019-08-21 ENCOUNTER — Other Ambulatory Visit: Payer: Self-pay

## 2019-08-21 ENCOUNTER — Ambulatory Visit: Payer: Managed Care, Other (non HMO) | Admitting: Pulmonary Disease

## 2019-08-21 VITALS — BP 130/80 | HR 98 | Temp 97.6°F | Ht 65.0 in | Wt 145.0 lb

## 2019-08-21 DIAGNOSIS — J45909 Unspecified asthma, uncomplicated: Secondary | ICD-10-CM

## 2019-08-21 DIAGNOSIS — R0602 Shortness of breath: Secondary | ICD-10-CM

## 2019-08-21 DIAGNOSIS — J383 Other diseases of vocal cords: Secondary | ICD-10-CM

## 2019-08-21 DIAGNOSIS — Z7952 Long term (current) use of systemic steroids: Secondary | ICD-10-CM

## 2019-08-21 NOTE — Patient Instructions (Signed)
1.  Continue tapering prednisone as you are doing.  2.  Follow Xolair injections as you are doing.  3.  We will see him in follow-up in 3 to 4 weeks time.  We will schedule you with a nurse practitioner.

## 2019-08-21 NOTE — Progress Notes (Signed)
    Assessment & Plan:  1. 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 Vocal cord dysfunction (Primary)  3. Shortness of breath   Patient Instructions  1.  Continue tapering prednisone  as you are doing.  2.  Follow Xolair  injections as you are doing.  3.  We will see him in follow-up in 3 to 4 weeks time.  We will schedule you with a nurse practitioner.  Please note: late entry documentation due to logistical difficulties during COVID-19 pandemic. This note is filed for information purposes only, and is not intended to be used for billing, nor does it represent the full scope/nature of the visit in question. Please see any associated scanned media linked to date of encounter for additional pertinent information.  Subjective:    HPI: Susan Payne is a 38 y.o. female presenting to the pulmonology clinic on 08/21/2019 with report of: Follow-up (review PFT- currently on 33mg  of prednisone . c/o sob with exertion and at rest and wheezing. )     Outpatient Encounter Medications as of 08/21/2019  Medication Sig   [DISCONTINUED] albuterol  (VENTOLIN  HFA) 108 (90 Base) MCG/ACT inhaler INHALE 2 INHALATIONS INTO THE LUNGS EVERY 6 (SIX) HOURS AS NEEDED FOR WHEEZING.   [DISCONTINUED] budesonide -formoterol  (SYMBICORT ) 160-4.5 MCG/ACT inhaler INHALE 2 INHALATIONS INTO THE LUNGS 2 (TWO) TIMES DAILY   [DISCONTINUED] citalopram (CELEXA) 10 MG tablet Take 30 mg by mouth.    [DISCONTINUED] EPINEPHrine  0.3 mg/0.3 mL IJ SOAJ injection  (Patient not taking: Reported on 09/28/2023)   [DISCONTINUED] famotidine (PEPCID) 10 MG tablet Take 20 mg by mouth 2 (two) times daily.   [DISCONTINUED] ipratropium (ATROVENT ) 0.02 % nebulizer solution Take 2.5 mLs (0.5 mg total) by nebulization 2 (two) times daily.   [DISCONTINUED] loratadine (CLARITIN) 10 MG tablet Take by mouth.   [DISCONTINUED] LORazepam (ATIVAN) 0.5 MG tablet Take by mouth.   [DISCONTINUED] Nebulizers (VIOS LC SPRINT DELUXE) MISC  Use nebulizer q 4 hours as needed.  Please give with hand held mouth device.  Not mask   [DISCONTINUED] predniSONE  (DELTASONE ) 1 MG tablet Take as directed for prednisone  taper (Patient taking differently: 3 mg daily. Take as directed for prednisone  taper)   [DISCONTINUED] predniSONE  (DELTASONE ) 10 MG tablet Take 1 tablet (10 mg total) by mouth daily with breakfast. Take as directed, tapering   [DISCONTINUED] predniSONE  (DELTASONE ) 10 MG tablet Take 3 tablets daily or as directed for taper.   [DISCONTINUED] Spacer/Aero-Holding Chambers (AEROCHAMBER PLUS) inhaler Use as instructed   [DISCONTINUED] Tiotropium Bromide Monohydrate  (SPIRIVA  RESPIMAT) 2.5 MCG/ACT AERS Inhale 2 puffs into the lungs daily.   [DISCONTINUED] XOLAIR  150 MG/ML prefilled syringe Inject 300 mg into the skin every 14 (fourteen) days.   [DISCONTINUED] zafirlukast  (ACCOLATE ) 20 MG tablet Take 1 tablet (20 mg total) by mouth 2 (two) times daily before a meal.   [DISCONTINUED] albuterol  (PROVENTIL ) (2.5 MG/3ML) 0.083% nebulizer solution Inhale into the lungs.   [DISCONTINUED] dexlansoprazole (DEXILANT) 60 MG capsule Take by mouth.   No facility-administered encounter medications on file as of 08/21/2019.      Objective:   Vitals:   08/21/19 1151  BP: 130/80  Pulse: 98  Temp: 97.6 F (36.4 C)  Height: 5' 5 (1.651 m)  Weight: 145 lb (65.8 kg)  SpO2: 96%  TempSrc: Temporal  BMI (Calculated): 24.13     Physical exam documentation is limited by delayed entry of information.

## 2019-08-21 NOTE — Telephone Encounter (Signed)
Xolair Prefilled Syringe Received:  150mg  Prefilled Syringe >> quantity #4, lot # [id number], exp date 04/2020 75mg  Prefilled Syringe >> N/A Medication arrival date: 08/21/2019 Received by: Desmond Dike, [place]

## 2019-08-22 ENCOUNTER — Ambulatory Visit (INDEPENDENT_AMBULATORY_CARE_PROVIDER_SITE_OTHER): Payer: Managed Care, Other (non HMO)

## 2019-08-22 DIAGNOSIS — J4550 Severe persistent asthma, uncomplicated: Secondary | ICD-10-CM

## 2019-08-22 MED ORDER — OMALIZUMAB 150 MG/ML ~~LOC~~ SOSY
300.0000 mg | PREFILLED_SYRINGE | Freq: Once | SUBCUTANEOUS | Status: AC
  Administered 2019-08-22: 300 mg via SUBCUTANEOUS

## 2019-08-22 NOTE — Progress Notes (Signed)
Patient presented to the office today for  Xolair injection. Patient was already on Xolair therapy when she started seeing Dr. Patsey Berthold. Per Dr. Mortimer Fries (Dr. Patsey Berthold was not available), patient is to wait 2 hours after injection as this is her first injection with our office.  Primary Pulmonologist:  Dr. Vernard Gambles Medication name: Xolair Strength: 300mg   Site(s): L and R Arm  Epi pen/Auvi-Q visible during appointment: Yes  Time of injection: 9937  Patient evaluated every 15-20 minutes per protocol x2 hours.  1st check: 1500 Evaluation: No Reactions  2nd check: 1520 Evaluation: No Reactions  3rd check: 1540 Evaluation: No Reactions  4th check: 1600 Evaluation: No Reactions  5th check: 1620 Evaluation: No Reactions  6th check: 1640 Evaluation: No Reactions

## 2019-08-27 ENCOUNTER — Telehealth: Payer: Self-pay | Admitting: Pulmonary Disease

## 2019-08-27 MED ORDER — ZAFIRLUKAST 20 MG PO TABS: 20.0000 mg | ORAL_TABLET | Freq: Two times a day (BID) | ORAL | 3 refills | Status: DC

## 2019-08-27 NOTE — Telephone Encounter (Signed)
Created in error

## 2019-09-05 ENCOUNTER — Other Ambulatory Visit: Payer: Self-pay

## 2019-09-05 ENCOUNTER — Ambulatory Visit (INDEPENDENT_AMBULATORY_CARE_PROVIDER_SITE_OTHER): Payer: Managed Care, Other (non HMO)

## 2019-09-05 DIAGNOSIS — J4550 Severe persistent asthma, uncomplicated: Secondary | ICD-10-CM | POA: Diagnosis not present

## 2019-09-05 MED ORDER — OMALIZUMAB 150 MG/ML ~~LOC~~ SOSY
300.0000 mg | PREFILLED_SYRINGE | Freq: Once | SUBCUTANEOUS | Status: AC
  Administered 2019-09-05: 17:00:00 300 mg via SUBCUTANEOUS

## 2019-09-05 NOTE — Progress Notes (Signed)
Have you been hospitalized within the last 10 days?  No Do you have a fever?  No Do you have a cough?  No Do you have a headache or sore throat? No Do you have your Epi Pen visible and is it within date?  Yes   Patient received second Xolair injections from this office.  Patient stated she started Xolair in 03/2019.   Patient monitored for 15 mins. Xolair injection given 1645, 150mg /ml in right arm.  150mg /ml in left arm.  1700- Patient denies any adverse effects.  No redness or pain at injection sites. Patient scheduled 2 week injection. Nothing further at this time.

## 2019-09-10 ENCOUNTER — Telehealth: Payer: Self-pay | Admitting: Pulmonary Disease

## 2019-09-11 NOTE — Telephone Encounter (Signed)
Xolair Prefilled Syringe Order: 150mg  Prefilled Syringe:  #4 75mg  Prefilled Syringe: N/A Ordered Date: 09/11/19  Expected date of arrival: 09/17/2019 Ordered by: Len Blalock, [place]: [facility]

## 2019-09-16 ENCOUNTER — Encounter: Payer: Self-pay | Admitting: Pulmonary Disease

## 2019-09-16 ENCOUNTER — Ambulatory Visit (INDEPENDENT_AMBULATORY_CARE_PROVIDER_SITE_OTHER): Payer: Managed Care, Other (non HMO) | Admitting: Pulmonary Disease

## 2019-09-16 DIAGNOSIS — K219 Gastro-esophageal reflux disease without esophagitis: Secondary | ICD-10-CM

## 2019-09-16 DIAGNOSIS — J309 Allergic rhinitis, unspecified: Secondary | ICD-10-CM

## 2019-09-16 DIAGNOSIS — R768 Other specified abnormal immunological findings in serum: Secondary | ICD-10-CM | POA: Diagnosis not present

## 2019-09-16 DIAGNOSIS — J4530 Mild persistent asthma, uncomplicated: Secondary | ICD-10-CM | POA: Diagnosis not present

## 2019-09-16 DIAGNOSIS — Z9109 Other allergy status, other than to drugs and biological substances: Secondary | ICD-10-CM | POA: Insufficient documentation

## 2019-09-16 DIAGNOSIS — J383 Other diseases of vocal cords: Secondary | ICD-10-CM | POA: Insufficient documentation

## 2019-09-16 NOTE — Assessment & Plan Note (Addendum)
Plan:  Continue breathing exercises  Referral to [facility] Continue to continue to taper prednisone

## 2019-09-16 NOTE — Progress Notes (Signed)
Virtual Visit via Telephone Note  I connected with Susan Payne on 09/16/19 at  3:30 PM EST by telephone and verified that I am speaking with the correct person using two identifiers.  Location: Patient: Home Provider: Office [facility] - [address] [place], [address], [place], [place] [address]   I discussed the limitations, risks, security and privacy concerns of performing an evaluation and management service by telephone and the availability of in person appointments. I also discussed with the patient that there may be a patient responsible charge related to this service. The patient expressed understanding and agreed to proceed.  Patient consented to consult via telephone: Yes People present and their role in pt care: Pt   History of Present Illness:  38 year old former smoker followed in our office for asthma and vcd  PMH: GERD, anxiety, depression Smoking History: Former Smoker. Quit 2012. 18 pack year smoker.  Maintenance: Symbicort 160, Spiriva Respimat 2.5, Xolair, predisone 26mg   Pt of Dr.   Chief complaint: 3 to 4-week follow-up, last seen on 08/21/2019   38 year old female former smoker followed in our office for asthma.  Patient is managed on Symbicort 160, Spiriva Respimat 2.5 and Xolair.  Patient is currently tapering prednisone down.  She was last seen by Dr. 24 on 08/21/2019.  Patient was started on high dose chronic steroids by Dr. 13/01/2019 and pulmonologist at [facility].  Patient has been working to decrease down on the steroids since been established with our office.  She has also been started on Xolair injections.  She does have blood allergy testing that shows allergies to cat dander, dog dander, maple [place] and [place] IgE.  Largest elevations being to cat dander.  Patient was previously living with 2 cats they have not been transition to be outside cats.  Patient does have 2 dogs that also live with her that are too old to be  transitioned outside.  They have replaced all of the carpets in her house.  They have also replaced the sofa.  She continues to work with allergy asthma Dr. [facility].   Observations/Objective:  06/20/2019-allergy profile-cat dander, dog dander, maple [place], [facility] IgE, elevated, largest elevation cat dander, IgE 385  02/13/2019-chest x-ray-chest radiograph the within normal limits  07/05/2019-echocardiogram-LV ejection fraction 60 to 65%, normal pulmonary artery systolic pressure, global right ventricle is normal systolic function  Social History   Tobacco Use  Smoking Status Former Smoker  . Packs/day: 1.00  . Years: 18.00  . Pack years: 18.00  . Types: Cigarettes  . Quit date: 2012  . Years since quitting: 8.9  Smokeless Tobacco Never Used   Immunization History  Administered Date(s) Administered  . Influenza, Seasonal, Injecte, Preservative Fre 08/23/2013  . Influenza,inj,Quad PF,6+ Mos 07/01/2014  . Influenza-Unspecified 07/01/2014  . Pneumococcal Polysaccharide-23 04/24/2013  . Tdap 05/13/2016   Assessment and Plan:  Vocal cord dysfunction Plan:  Continue breathing exercises  Referral to [facility] Continue to continue to taper prednisone   Elevated IgE level Plan:  Continue Xolair injections  Mild persistent asthma Plan:  Continue Symbicort 160 Continue Spiriva Resp   continue to decrease prednisone as already instructed by Dr. 05/15/2016 Continue Xolair injections Continue nasal saline rinses twice daily Continue work with allergy asthma   Environmental allergies Removed carpets and sofa 2 cats (now outside) 2 dogs inside Established with allergy asthma Dr. Jayme Cloud  Plan:  Continue follow up with allergist  I believe it is okay to  consider restarting immunotherapy in calendar year 2021 as we will likely have you off of chronic steroid use at that point in time Continue nasal saline rinses 2 times daily Continue Xolair  injections    Allergic rhinitis Plan: Continue Xolair injections Continue nasal saline rinses twice daily Continue to avoid known allergen triggers such as cats, dogs, trees Continue to work with allergy asthma Can consider restarting immunotherapy in calendar year 2021 as we will hopefully have you tapered off of prednisone at that point in time  Follow Up Instructions:  Return in about 2 months (around 11/16/2019), or if symptoms worsen or fail to improve, for [facility] - Dr. Patsey Berthold.   I discussed the assessment and treatment plan with the patient. The patient was provided an opportunity to ask questions and all were answered. The patient agreed with the plan and demonstrated an understanding of the instructions.   The patient was advised to call back or seek an in-person evaluation if the symptoms worsen or if the condition fails to improve as anticipated.  I provided 23 minutes of non-face-to-face time during this encounter.   Lauraine Rinne, NP

## 2019-09-16 NOTE — Patient Instructions (Addendum)
You were seen today by Lauraine Rinne, NP  for:   1. Allergic rhinitis, unspecified seasonality, unspecified trigger  2. Mild persistent asthma without complication  3. Gastroesophageal reflux disease, unspecified whether esophagitis present  4. Elevated IgE level  5. Vocal cord dysfunction - Ambulatory referral to ENT  6. Environmental allergies   Continue to taper down your prednisone as instructed  Continue inhalers as instructed Referral to [facility] for vocal cord dysfunction Continue Xolair injections Start nasal saline rinses 2 times daily    We recommend today:  Orders Placed This Encounter  Procedures  . Ambulatory referral to ENT    Referral Priority:   Routine    Referral Type:   Consultation    Referral Reason:   Specialty Services Required    Requested Specialty:   Otolaryngology    Number of Visits Requested:   1   Orders Placed This Encounter  Procedures  . Ambulatory referral to ENT   No orders of the defined types were placed in this encounter.   Follow Up:    Return in about 2 months (around 11/16/2019), or if symptoms worsen or fail to improve, for [facility] - Dr. Patsey Berthold.   Please do your part to reduce the spread of COVID-19:      Reduce your risk of any infection  and COVID19 by using the similar precautions used for avoiding the common cold or flu:  Marland Kitchen Wash your hands often with soap and warm water for at least 20 seconds.  If soap and water are not readily available, use an alcohol-based hand sanitizer with at least 60% alcohol.  . If coughing or sneezing, cover your mouth and nose by coughing or sneezing into the elbow areas of your shirt or coat, into a tissue or into your sleeve (not your hands). Langley Gauss A MASK when in public  . Avoid shaking hands with others and consider head nods or verbal greetings only. . Avoid touching your eyes, nose, or mouth with unwashed hands.  . Avoid close contact with people who are sick. .  Avoid places or events with large numbers of people in one location, like concerts or sporting events. . If you have some symptoms but not all symptoms, continue to monitor at home and seek medical attention if your symptoms worsen. . If you are having a medical emergency, call 911.   [place] / e-Visit: [email or web address]         [facility]: [facility]: [phone]                   [facility]: [phone]     It is flu season:   >>> Best ways to protect herself from the flu: Receive the yearly flu vaccine, practice good hand hygiene washing with soap and also using hand sanitizer when available, eat a nutritious meals, get adequate rest, hydrate appropriately   Please contact the office if your symptoms worsen or you have concerns that you are not improving.   Thank you for choosing [facility] for your healthcare, and for allowing [place] to partner with you on your healthcare journey. I am thankful to be able to provide care to you today.   Wyn Quaker FNP-C

## 2019-09-16 NOTE — Assessment & Plan Note (Signed)
Plan: Continue Xolair injections Continue nasal saline rinses twice daily Continue to avoid known allergen triggers such as cats, dogs, trees Continue to work with allergy asthma Can consider restarting immunotherapy in calendar year 2021 as we will hopefully have you tapered off of prednisone at that point in time

## 2019-09-16 NOTE — Assessment & Plan Note (Signed)
Removed carpets and sofa 2 cats (now outside) 2 dogs inside Established with allergy asthma Dr. Donneta Romberg  Plan:  Continue follow up with allergist  I believe it is okay to consider restarting immunotherapy in calendar year 2021 as we will likely have you off of chronic steroid use at that point in time Continue nasal saline rinses 2 times daily Continue Xolair injections

## 2019-09-16 NOTE — Assessment & Plan Note (Addendum)
Plan:  Continue Symbicort 160 Continue Spiriva Resp   continue to decrease prednisone as already instructed by Dr. Patsey Berthold Continue Xolair injections Continue nasal saline rinses twice daily Continue work with allergy asthma

## 2019-09-16 NOTE — Assessment & Plan Note (Signed)
Plan:  Continue Xolair injections

## 2019-09-18 NOTE — Telephone Encounter (Signed)
Xolair Prefilled Syringe Received:  150mg  Prefilled Syringe >> quantity #4, lot # [id number], exp date 05/2020 75mg  Prefilled Syringe >> N/A Medication arrival date: 09/18/2019 Received by: Desmond Dike, [place]

## 2019-09-19 ENCOUNTER — Ambulatory Visit: Payer: Managed Care, Other (non HMO)

## 2019-09-23 ENCOUNTER — Ambulatory Visit (INDEPENDENT_AMBULATORY_CARE_PROVIDER_SITE_OTHER): Payer: Managed Care, Other (non HMO)

## 2019-09-23 ENCOUNTER — Other Ambulatory Visit: Payer: Self-pay

## 2019-09-23 DIAGNOSIS — J4530 Mild persistent asthma, uncomplicated: Secondary | ICD-10-CM | POA: Diagnosis not present

## 2019-09-23 DIAGNOSIS — J4550 Severe persistent asthma, uncomplicated: Secondary | ICD-10-CM | POA: Diagnosis not present

## 2019-09-23 MED ORDER — OMALIZUMAB 150 MG/ML ~~LOC~~ SOSY
300.0000 mg | PREFILLED_SYRINGE | Freq: Once | SUBCUTANEOUS | Status: AC
  Administered 2019-09-23: 14:00:00 300 mg via SUBCUTANEOUS

## 2019-09-23 MED ORDER — OMALIZUMAB 150 MG/ML ~~LOC~~ SOSY: 150.0000 mg | PREFILLED_SYRINGE | Freq: Once | SUBCUTANEOUS | Status: DC

## 2019-09-23 NOTE — Progress Notes (Signed)
Have you been hospitalized within the last 10 days?  No Do you have a fever?  No Do you have a cough?  No Do you have a headache or sore throat? No Do you have your Epi Pen visible and is it within date?  Yes 

## 2019-09-26 ENCOUNTER — Other Ambulatory Visit: Payer: Self-pay

## 2019-09-26 DIAGNOSIS — Z20822 Contact with and (suspected) exposure to covid-19: Secondary | ICD-10-CM

## 2019-09-28 LAB — NOVEL CORONAVIRUS, NAA: SARS-CoV-2, NAA: NOT DETECTED

## 2019-10-07 ENCOUNTER — Ambulatory Visit (INDEPENDENT_AMBULATORY_CARE_PROVIDER_SITE_OTHER): Payer: Managed Care, Other (non HMO)

## 2019-10-07 ENCOUNTER — Other Ambulatory Visit: Payer: Self-pay

## 2019-10-07 DIAGNOSIS — J4530 Mild persistent asthma, uncomplicated: Secondary | ICD-10-CM | POA: Diagnosis not present

## 2019-10-07 MED ORDER — OMALIZUMAB 150 MG/ML ~~LOC~~ SOSY
300.0000 mg | PREFILLED_SYRINGE | Freq: Once | SUBCUTANEOUS | Status: AC
  Administered 2019-10-07: 300 mg via SUBCUTANEOUS

## 2019-10-07 NOTE — Progress Notes (Signed)
All questions were answered by the patient before medication was administered. Have you been hospitalized in the last 10 days? No Do you have a fever? No Do you have a cough? No Do you have a headache or sore throat? No  

## 2019-10-14 ENCOUNTER — Telehealth: Payer: Self-pay | Admitting: Pulmonary Disease

## 2019-10-14 NOTE — Telephone Encounter (Signed)
ATC [facility] to set up [place] delivery for next weeks appointment.  New PA is required to continue injections. Fax being sent to start Xolair PA. Will follow up.

## 2019-10-14 NOTE — Telephone Encounter (Signed)
Xolair PA initiated on CMM.  May take up to 5 business days to receive decision. [place] reference [id number].

## 2019-10-17 NOTE — Telephone Encounter (Signed)
Checked on progress of Xolair PA for 2021. At this time Xolair PA is still pending. [employer] requested information faxed to [phone]. PA was started 10/14/19 and may take up to 5 business days for determination. Will follow up.

## 2019-10-22 NOTE — Telephone Encounter (Signed)
Received a fax from [place]. They are no longer doing PA's over the phone, all PA's have to be done electronically. PA has been started on Cover My Meds. Key: [id number]. Will await PA decision.

## 2019-10-23 NOTE — Telephone Encounter (Signed)
Received a fax from [place]. The pt's PA on Cover My Meds was not processed. Fax states that the coverage review is handled by another organization.  After looking through pt's previous Xolair approval information. PA should be done with [facility]. New PA has been submitted on Cover My Meds. Key: [id number]. Will await response.  ATC pt, she has an appointment tomorrow for her Xolair injection. This will need to be rescheduled. I did not receive an answer and I couldn't leave a message due to her VM being full.

## 2019-10-24 ENCOUNTER — Ambulatory Visit: Payer: Managed Care, Other (non HMO)

## 2019-10-24 ENCOUNTER — Telehealth: Payer: Self-pay | Admitting: Pulmonary Disease

## 2019-10-24 NOTE — Telephone Encounter (Signed)
ATC to set up shipment for pt's Xolair. [facility] needs pt's consent to ship before shipment can be set up. They attempted to get in touch with the pt but she did not answer and her voicemail is full.  I spoke with pt. She is aware that her appointment will need to be rescheduled and to also contact [facility] to give them the consent to ship.  Will follow up with [facility] this afternoon.

## 2019-10-24 NOTE — Telephone Encounter (Signed)
PA has been approved through 10/22/2020. Will contact [facility] to set up shipment.

## 2019-10-24 NOTE — Telephone Encounter (Signed)
Called [facility] to set up shipment for Monday. Was advised that the shipment has already been set up to be delivered to the pt's home tomorrow.  Spoke with pt. She arranged for the medication to be shipped to her instead of it coming here. Pt has been scheduled for her injection on 10/28/2019 at 1630. Nothing further was needed.

## 2019-10-28 ENCOUNTER — Telehealth: Payer: Self-pay | Admitting: Pulmonary Disease

## 2019-10-28 ENCOUNTER — Other Ambulatory Visit: Payer: Self-pay

## 2019-10-28 ENCOUNTER — Ambulatory Visit (INDEPENDENT_AMBULATORY_CARE_PROVIDER_SITE_OTHER): Payer: Managed Care, Other (non HMO)

## 2019-10-28 DIAGNOSIS — J4530 Mild persistent asthma, uncomplicated: Secondary | ICD-10-CM | POA: Diagnosis not present

## 2019-10-28 MED ORDER — OMALIZUMAB 150 MG/ML ~~LOC~~ SOSY
300.0000 mg | PREFILLED_SYRINGE | Freq: Once | SUBCUTANEOUS | Status: AC
  Administered 2019-10-28: 300 mg via SUBCUTANEOUS

## 2019-10-28 NOTE — Progress Notes (Signed)
Have you been hospitalized within the last 10 days?  No Do you have a fever?  No Do you have a cough?  No Do you have a headache or sore throat? No Do you have your Epi Pen visible and is it within date?  Yes 

## 2019-10-28 NOTE — Telephone Encounter (Signed)
Xolair Prefilled Syringe Received:  150mg  Prefilled Syringe >> quantity #2, lot # , exp date 06/17/2020 75mg  Prefilled Syringe >> #N/A Medication arrival date: 10/28/19 Received by: Nicolasa Milbrath,LPN

## 2019-10-29 NOTE — Telephone Encounter (Signed)
See telephone encounter (10/24/2019)

## 2019-11-14 ENCOUNTER — Ambulatory Visit (INDEPENDENT_AMBULATORY_CARE_PROVIDER_SITE_OTHER): Payer: Managed Care, Other (non HMO)

## 2019-11-14 ENCOUNTER — Other Ambulatory Visit: Payer: Self-pay

## 2019-11-14 DIAGNOSIS — J4530 Mild persistent asthma, uncomplicated: Secondary | ICD-10-CM

## 2019-11-14 DIAGNOSIS — J4550 Severe persistent asthma, uncomplicated: Secondary | ICD-10-CM | POA: Diagnosis not present

## 2019-11-14 MED ORDER — OMALIZUMAB 150 MG/ML ~~LOC~~ SOSY
300.0000 mg | PREFILLED_SYRINGE | Freq: Once | SUBCUTANEOUS | Status: AC
  Administered 2019-11-14: 300 mg via SUBCUTANEOUS

## 2019-11-14 NOTE — Progress Notes (Signed)
All questions were answered by the patient before medication was administered. Have you been hospitalized in the last 10 days? No Do you have a fever? No Do you have a cough? No Do you have a headache or sore throat? No  

## 2019-11-18 ENCOUNTER — Telehealth: Payer: Self-pay | Admitting: Pulmonary Disease

## 2019-11-18 ENCOUNTER — Other Ambulatory Visit: Payer: Self-pay

## 2019-11-18 MED ORDER — SPIRIVA RESPIMAT 2.5 MCG/ACT IN AERS: 2.0000 | INHALATION_SPRAY | Freq: Every day | RESPIRATORY_TRACT | 6 refills | Status: DC

## 2019-11-18 NOTE — Telephone Encounter (Signed)
Xolair Prefilled Syringe Order: 150mg  Prefilled Syringe:  #4 75mg  Prefilled Syringe: #N/A Ordered Date: 11/18/19 Expected date of arrival: 11/21/19 Ordered by: Soren Lazarz,LPN Specialty Pharmacy: 01/16/20

## 2019-11-18 NOTE — Telephone Encounter (Signed)
Received Rx request from [facility] for Spiriva 2.5. Rx has been sent to preferred pharmacy. Nothing further is needed.

## 2019-11-21 NOTE — Telephone Encounter (Signed)
Xolair Prefilled Syringe Received:  150mg  Prefilled Syringe >> quantity #4, lot # , exp date 06/17/2020 75mg  Prefilled Syringe >> N/A Medication arrival date: 11/21/19 Received by: Keyundra Fant,LPN

## 2019-11-27 ENCOUNTER — Other Ambulatory Visit: Payer: Self-pay | Admitting: Pulmonary Disease

## 2019-11-28 ENCOUNTER — Other Ambulatory Visit: Payer: Self-pay

## 2019-11-28 ENCOUNTER — Ambulatory Visit (INDEPENDENT_AMBULATORY_CARE_PROVIDER_SITE_OTHER): Payer: Managed Care, Other (non HMO)

## 2019-11-28 DIAGNOSIS — J4530 Mild persistent asthma, uncomplicated: Secondary | ICD-10-CM

## 2019-11-28 MED ORDER — OMALIZUMAB 150 MG/ML ~~LOC~~ SOSY
300.0000 mg | PREFILLED_SYRINGE | Freq: Once | SUBCUTANEOUS | Status: AC
  Administered 2019-11-28: 17:00:00 300 mg via SUBCUTANEOUS

## 2019-11-28 NOTE — Telephone Encounter (Signed)
Dr. Jayme Cloud please advise if okay to refill albuterol HFA. Rx not originally prescribed by our office.

## 2019-11-28 NOTE — Progress Notes (Signed)
Have you been hospitalized within the last 10 days?  No Do you have a fever?  No Do you have a cough?  No Do you have a headache or sore throat? No Do you have your Epi Pen visible and is it within date?  Yes 

## 2019-11-29 ENCOUNTER — Encounter: Payer: Self-pay | Admitting: Pulmonary Disease

## 2019-11-29 ENCOUNTER — Ambulatory Visit: Payer: Managed Care, Other (non HMO) | Admitting: Pulmonary Disease

## 2019-11-29 DIAGNOSIS — J383 Other diseases of vocal cords: Secondary | ICD-10-CM

## 2019-11-29 DIAGNOSIS — R768 Other specified abnormal immunological findings in serum: Secondary | ICD-10-CM

## 2019-11-29 DIAGNOSIS — J309 Allergic rhinitis, unspecified: Secondary | ICD-10-CM

## 2019-11-29 DIAGNOSIS — J4550 Severe persistent asthma, uncomplicated: Secondary | ICD-10-CM

## 2019-11-29 DIAGNOSIS — K219 Gastro-esophageal reflux disease without esophagitis: Secondary | ICD-10-CM

## 2019-11-29 MED ORDER — ZAFIRLUKAST 20 MG PO TABS: 20.0000 mg | ORAL_TABLET | Freq: Two times a day (BID) | ORAL | 0 refills | Status: DC

## 2019-11-29 MED ORDER — IPRATROPIUM BROMIDE HFA 17 MCG/ACT IN AERS: 2.0000 | INHALATION_SPRAY | Freq: Four times a day (QID) | RESPIRATORY_TRACT | 4 refills | Status: DC | PRN

## 2019-11-29 MED ORDER — ALBUTEROL SULFATE HFA 108 (90 BASE) MCG/ACT IN AERS: 2.0000 | INHALATION_SPRAY | RESPIRATORY_TRACT | 6 refills | Status: DC | PRN

## 2019-11-29 NOTE — Progress Notes (Signed)
 Assessment & Plan:  1. Severe persistent asthma without complication (Primary)  2. Allergic rhinitis, unspecified seasonality, unspecified trigger  3. Gastroesophageal reflux disease, unspecified whether esophagitis present  4. Elevated IgE level  5. Vocal cord dysfunction   Patient Instructions  We refilled your emergency inhalers  Continue Xolair  and your maintenance inhalers as they are  You may resume your allergy shots once you are off of prednisone   We will see you in follow-up in 2 months time  Please note: late entry documentation due to logistical difficulties during COVID-19 pandemic. This note is filed for information purposes only, and is not intended to be used for billing, nor does it represent the full scope/nature of the visit in question. Please see any associated scanned media linked to date of encounter for additional pertinent information.  Subjective:    HPI: Susan Payne is a 39 y.o. female presenting to the pulmonology clinic on 11/29/2019 with report of: No chief complaint on file.   Virtual Visit Via Video or Telephone Note:   This visit type was conducted due to national recommendations for restrictions regarding the COVID-19 pandemic .  This format is felt to be most appropriate for this patient at this time.  All issues noted in this document were discussed and addressed.  No physical exam was performed (except for noted visual exam findings with Video Visits).    I connected with XXX  by a video enabled telemedicine application or telephone at 11:30 AM and verified that I was speaking with the correct person using two identifiers. Location patient: home Location provider: [facility] Persons participating in the virtual visit: patient, physician   I discussed the limitations, risks, security and privacy concerns of performing an evaluation and management service by video and the availability of in person appointments. The  patient expressed understanding and agreed to proceed.  Outpatient Encounter Medications as of 11/29/2019  Medication Sig   [DISCONTINUED] albuterol  (PROVENTIL ) (2.5 MG/3ML) 0.083% nebulizer solution Inhale into the lungs.   [DISCONTINUED] albuterol  (VENTOLIN  HFA) 108 (90 Base) MCG/ACT inhaler INHALE 2 INHALATIONS INTO THE LUNGS EVERY 6 (SIX) HOURS AS NEEDED FOR WHEEZING.   [DISCONTINUED] albuterol  (VENTOLIN  HFA) 108 (90 Base) MCG/ACT inhaler Inhale 2 puffs into the lungs every 4 (four) hours as needed for wheezing or shortness of breath.   [DISCONTINUED] budesonide -formoterol  (SYMBICORT ) 160-4.5 MCG/ACT inhaler INHALE 2 INHALATIONS INTO THE LUNGS 2 (TWO) TIMES DAILY   [DISCONTINUED] citalopram (CELEXA) 10 MG tablet Take 30 mg by mouth.    [DISCONTINUED] dexlansoprazole (DEXILANT) 60 MG capsule Take by mouth.   [DISCONTINUED] EPINEPHrine  0.3 mg/0.3 mL IJ SOAJ injection  (Patient not taking: Reported on 09/28/2023)   [DISCONTINUED] famotidine (PEPCID) 10 MG tablet Take 20 mg by mouth 2 (two) times daily.   [DISCONTINUED] ipratropium (ATROVENT  HFA) 17 MCG/ACT inhaler Inhale 2 puffs into the lungs every 6 (six) hours as needed for wheezing.   [DISCONTINUED] ipratropium (ATROVENT ) 0.02 % nebulizer solution Take 2.5 mLs (0.5 mg total) by nebulization 2 (two) times daily.   [DISCONTINUED] loratadine (CLARITIN) 10 MG tablet Take by mouth.   [DISCONTINUED] LORazepam (ATIVAN) 0.5 MG tablet Take by mouth.   [DISCONTINUED] Nebulizers (VIOS LC SPRINT DELUXE) MISC Use nebulizer q 4 hours as needed.  Please give with hand held mouth device.  Not mask   [DISCONTINUED] predniSONE  (DELTASONE ) 1 MG tablet Take as directed for prednisone  taper (Patient taking differently: 3 mg daily. Take as directed for prednisone  taper)   [DISCONTINUED] predniSONE  (DELTASONE ) 10  MG tablet Take 3 tablets daily or as directed for taper.   [DISCONTINUED] Spacer/Aero-Holding Chambers (AEROCHAMBER PLUS) inhaler Use as instructed    [DISCONTINUED] sucralfate (CARAFATE) 1 GM/10ML suspension Take 1 g by mouth 2 (two) times daily.   [DISCONTINUED] Tiotropium Bromide Monohydrate  (SPIRIVA  RESPIMAT) 2.5 MCG/ACT AERS Inhale 2 puffs into the lungs daily.   [DISCONTINUED] XOLAIR  150 MG/ML prefilled syringe Inject 300 mg into the skin every 14 (fourteen) days.   [DISCONTINUED] zafirlukast  (ACCOLATE ) 20 MG tablet Take 1 tablet (20 mg total) by mouth 2 (two) times daily before a meal.   [DISCONTINUED] zafirlukast  (ACCOLATE ) 20 MG tablet Take 1 tablet (20 mg total) by mouth 2 (two) times daily before a meal. (Patient not taking: Reported on 07/07/2020)   No facility-administered encounter medications on file as of 11/29/2019.      Objective:   There were no vitals filed for this visit.   Physical exam documentation is limited by delayed entry of information.

## 2019-11-29 NOTE — Patient Instructions (Signed)
We refilled your emergency inhalers  Continue Xolair and your maintenance inhalers as they are  You may resume your allergy shots once you are off of prednisone  We will see you in follow-up in 2 months time

## 2019-12-09 ENCOUNTER — Ambulatory Visit
Admission: EM | Admit: 2019-12-09 | Discharge: 2019-12-09 | Disposition: A | Discharge status: Home / Self Care | Payer: Managed Care, Other (non HMO) | Attending: Family Medicine | Admitting: Family Medicine

## 2019-12-09 DIAGNOSIS — B029 Zoster without complications: Secondary | ICD-10-CM

## 2019-12-09 MED ORDER — TRAMADOL HCL 50 MG PO TABS: 50.0000 mg | ORAL_TABLET | Freq: Three times a day (TID) | ORAL | 0 refills | Status: DC | PRN

## 2019-12-09 MED ORDER — VALACYCLOVIR HCL 1 G PO TABS: 1000.0000 mg | ORAL_TABLET | Freq: Three times a day (TID) | ORAL | 0 refills | Status: AC

## 2019-12-09 NOTE — ED Provider Notes (Signed)
[facility]    CSN: [id number] Arrival date & time: 12/09/19  1200      History   Chief Complaint Chief Complaint  Patient presents with  . Rash   HPI  39 year old female presents with rash.  Patient is concerned that she has shingles.  Patient states that she is immunosuppressed.  She is on chronic prednisone for severe asthma.  She reports that she noticed a rash over the weekend.  She states that it started on the left side of the thoracic spine and has now spread around to underneath the left breast.  She believes that she has shingles.  She reports mild to moderate pain.  Was worse last night.  No relieving factors.  No other associated symptoms.  No other complaints.  Past Medical History:  Diagnosis Date  . Anxiety   . Asthma   . Reflux esophagitis     Patient Active Problem List   Diagnosis Date Noted  . 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 Vocal cord dysfunction 09/16/2019  . Environmental allergies 09/16/2019  . Depression 06/25/2019  . GERD (gastroesophageal reflux disease) 06/25/2019  . Mild persistent asthma 05/10/2016  . Allergic rhinitis 04/09/2014  . Healthcare maintenance 08/24/2013  . Anxiety 04/24/2013  . Asthma without status asthmaticus 02/25/2013  . Insomnia 02/25/2013    Past Surgical History:  Procedure Laterality Date  . CESAREAN SECTION      OB History   No obstetric history on file.      Home Medications    Prior to Admission medications   Medication Sig Start Date End Date Taking? Authorizing Provider  albuterol (VENTOLIN HFA) 108 (90 Base) MCG/ACT inhaler Inhale 2 puffs into the lungs every 4 (four) hours as needed for wheezing or shortness of breath. 11/29/19  Yes Tyler Pita, MD  azelastine (ASTELIN) 0.1 % nasal spray Place 1 spray into both nostrils 2 (two) times daily. Use in each nostril as directed   Yes [provider]  budesonide-formoterol (SYMBICORT) 160-4.5 MCG/ACT inhaler INHALE 2 INHALATIONS  INTO THE LUNGS 2 (TWO) TIMES DAILY 07/18/18  Yes [provider]  citalopram (CELEXA) 10 MG tablet Take 30 mg by mouth.    Yes [provider]  clonazePAM (KLONOPIN) 1 MG tablet Take 1 mg by mouth as needed for anxiety.   Yes [provider]  EPINEPHrine 0.3 mg/0.3 mL IJ SOAJ injection  03/21/19  Yes [provider]  famotidine (PEPCID) 10 MG tablet Take 20 mg by mouth 2 (two) times daily.   Yes [provider]  fluticasone (FLONASE) 50 MCG/ACT nasal spray Place 1 spray into both nostrils daily.   Yes [provider]  sucralfate (CARAFATE) 1 GM/10ML suspension Take 1 g by mouth 2 (two) times daily. 10/15/19  Yes [provider]  Tiotropium Bromide Monohydrate (SPIRIVA RESPIMAT) 2.5 MCG/ACT AERS Inhale 2 puffs into the lungs daily. 11/18/19  Yes Tyler Pita, MD  Arvid Right 150 MG/ML prefilled syringe Inject 300 mg into the skin every 14 (fourteen) days. 07/24/19  Yes Tyler Pita, MD  zafirlukast (ACCOLATE) 20 MG tablet Take 1 tablet (20 mg total) by mouth 2 (two) times daily before a meal. 11/29/19  Yes Tyler Pita, MD  dexlansoprazole [facility]) 60 MG capsule Take by mouth. 07/09/19 08/08/19  [provider]  predniSONE (DELTASONE) 1 MG tablet Take as directed for prednisone taper Patient taking differently: 3 mg daily. Take as directed for prednisone taper 07/10/19   Tyler Pita, MD  traMADol (ULTRAM) 50 MG tablet Take 1 tablet (50 mg total) by mouth every 8 (eight) hours as needed for moderate pain or severe pain. 12/09/19   Tommie Sams, DO  valACYclovir (VALTREX) 1000 MG tablet Take 1 tablet (1,000 mg total) by mouth 3 (three) times daily for 10 days. 12/09/19 12/19/19  Tommie Sams, DO  ipratropium (ATROVENT HFA) 17 MCG/ACT inhaler Inhale 2 puffs into the lungs every 6 (six) hours as needed for wheezing. 11/29/19 12/09/19  Salena Saner, MD  ipratropium (ATROVENT) 0.02 % nebulizer solution Take 2.5 mLs (0.5  mg total) by nebulization 2 (two) times daily. 12/29/18 12/09/19  Domenick Gong, MD  loratadine (CLARITIN) 10 MG tablet Take by mouth.  12/09/19  [provider]    Family History Family History  Problem Relation Age of Onset  . Asthma Mother     Social History Social History   Tobacco Use  . Smoking status: Former Smoker    Packs/day: 1.00    Years: 18.00    Pack years: 18.00    Types: Cigarettes    Quit date: 2012    Years since quitting: 9.1  . Smokeless tobacco: Never Used  Substance Use Topics  . Alcohol use: Yes    Comment: occasional   . Drug use: Never     Allergies   Cat hair extract, Dog epithelium allergy skin test, and Molds & smuts   Review of Systems Review of Systems  Constitutional: Negative.   Skin: Positive for rash.   Physical Exam Triage Vital Signs ED Triage Vitals  Enc Vitals Group     BP 12/09/19 1245 (!) 144/107     Pulse Rate 12/09/19 1245 75     Resp 12/09/19 1245 18     Temp 12/09/19 1245 98.6 F (37 C)     Temp Source 12/09/19 1245 Oral     SpO2 12/09/19 1245 100 %     Weight --      Height --      Head Circumference --      Peak Flow --      Pain Score 12/09/19 1232 7     Pain Loc --      Pain Edu? --      Excl. in GC? --    Updated Vital Signs BP (!) 144/107 (BP Location: Right Arm)   Pulse 75   Temp 98.6 F (37 C) (Oral)   Resp 18   LMP 11/27/2019 (Approximate)   SpO2 100%   Visual Acuity Right Eye Distance:   Left Eye Distance:   Bilateral Distance:    Right Eye Near:   Left Eye Near:    Bilateral Near:     Physical Exam Vitals and nursing note reviewed.  Constitutional:      General: She is not in acute distress.    Appearance: Normal appearance. She is not ill-appearing.  Eyes:     General:        Right eye: No discharge.        Left eye: No discharge.     Conjunctiva/sclera: Conjunctivae normal.  Cardiovascular:     Rate and Rhythm: Normal rate and regular rhythm.     Heart sounds: No  murmur.  Pulmonary:     Effort: Pulmonary effort is normal.     Breath sounds: Normal breath sounds. No wheezing, rhonchi or rales.  Chest:    Skin:         Comments: Raised, erythematous rash at  labelled location.  Neurological:     Mental Status: She is alert.  Psychiatric:        Mood and Affect: Mood normal.        Behavior: Behavior normal.    UC Treatments / Results  Labs (all labs ordered are listed, but only abnormal results are displayed) Labs Reviewed - No data to display  EKG   Radiology No results found.  Procedures Procedures (including critical care time)  Medications Ordered in UC Medications - No data to display  Initial Impression / Assessment and Plan / UC Course  I have reviewed the triage vital signs and the nursing notes.  Pertinent labs & imaging results that were available during my care of the patient were reviewed by me and considered in my medical decision making (see chart for details).    39 year old female presents with herpes zoster.  Valtrex as directed.  Tramadol as needed for pain.  Final Clinical Impressions(s) / UC Diagnoses   Final diagnoses:  Herpes zoster without complication   Discharge Instructions   None    ED Prescriptions    Medication Sig Dispense Auth. Provider   valACYclovir (VALTREX) 1000 MG tablet Take 1 tablet (1,000 mg total) by mouth 3 (three) times daily for 10 days. 30 tablet Conchetta Lamia G, DO   traMADol (ULTRAM) 50 MG tablet Take 1 tablet (50 mg total) by mouth every 8 (eight) hours as needed for moderate pain or severe pain. 15 tablet Everlene Other G, DO     I have reviewed the PDMP during this encounter.   Tommie Sams, [place] 12/09/19 1646

## 2019-12-09 NOTE — ED Triage Notes (Signed)
Rash noticed late last week.  Pt feels it is shingles.  Started in back then under left breast and on left side.  States she is immunosuppressed.

## 2019-12-12 ENCOUNTER — Other Ambulatory Visit: Payer: Self-pay

## 2019-12-12 ENCOUNTER — Ambulatory Visit (INDEPENDENT_AMBULATORY_CARE_PROVIDER_SITE_OTHER): Payer: Managed Care, Other (non HMO)

## 2019-12-12 DIAGNOSIS — J4550 Severe persistent asthma, uncomplicated: Secondary | ICD-10-CM

## 2019-12-12 MED ORDER — OMALIZUMAB 150 MG/ML ~~LOC~~ SOSY
300.0000 mg | PREFILLED_SYRINGE | Freq: Once | SUBCUTANEOUS | Status: AC
  Administered 2019-12-12: 300 mg via SUBCUTANEOUS

## 2019-12-12 NOTE — Progress Notes (Signed)
All questions were answered by the patient before medication was administered. Have you been hospitalized in the last 10 days? No Do you have a fever? No Do you have a cough? No Do you have a headache or sore throat? No  

## 2019-12-16 ENCOUNTER — Telehealth: Payer: Self-pay | Admitting: Pulmonary Disease

## 2019-12-16 NOTE — Telephone Encounter (Signed)
Xolair Prefilled Syringe Order: 150mg Prefilled Syringe:  #2 75mg Prefilled Syringe: N/A Ordered Date: 12/16/2019 Expected date of arrival: 12/19/2019 Ordered by: Tatym Schermer, CMA  Specialty Pharmacy: [facility]  

## 2019-12-19 NOTE — Telephone Encounter (Signed)
Xolair Prefilled Syringe Received:  150mg  Prefilled Syringe >> quantity #4, lot # , exp date 07/2020 75mg  Prefilled Syringe >> N/A Medication arrival date: 12/19/2019 Received by: , CMA

## 2019-12-26 ENCOUNTER — Other Ambulatory Visit: Payer: Self-pay

## 2019-12-26 ENCOUNTER — Ambulatory Visit (INDEPENDENT_AMBULATORY_CARE_PROVIDER_SITE_OTHER): Payer: Managed Care, Other (non HMO)

## 2019-12-26 DIAGNOSIS — J4550 Severe persistent asthma, uncomplicated: Secondary | ICD-10-CM

## 2019-12-26 MED ORDER — OMALIZUMAB 150 MG/ML ~~LOC~~ SOSY
300.0000 mg | PREFILLED_SYRINGE | Freq: Once | SUBCUTANEOUS | Status: AC
  Administered 2019-12-26: 17:00:00 300 mg via SUBCUTANEOUS

## 2019-12-26 NOTE — Progress Notes (Signed)
All questions were answered by the patient before medication was administered. Have you been hospitalized in the last 10 days? No Do you have a fever? No Do you have a cough? No Do you have a headache or sore throat? No  

## 2020-01-09 ENCOUNTER — Telehealth: Payer: Self-pay | Admitting: Pulmonary Disease

## 2020-01-09 NOTE — Telephone Encounter (Signed)
Spoke with pt. She has been rescheduled to 1630 on 01/10/2020. Nothing further needed.

## 2020-01-10 ENCOUNTER — Other Ambulatory Visit: Payer: Self-pay

## 2020-01-10 ENCOUNTER — Ambulatory Visit: Payer: Managed Care, Other (non HMO)

## 2020-01-10 ENCOUNTER — Ambulatory Visit (INDEPENDENT_AMBULATORY_CARE_PROVIDER_SITE_OTHER): Payer: Managed Care, Other (non HMO)

## 2020-01-10 DIAGNOSIS — J4550 Severe persistent asthma, uncomplicated: Secondary | ICD-10-CM | POA: Diagnosis not present

## 2020-01-10 MED ORDER — OMALIZUMAB 150 MG/ML ~~LOC~~ SOSY
300.0000 mg | PREFILLED_SYRINGE | Freq: Once | SUBCUTANEOUS | Status: AC
  Administered 2020-01-10: 300 mg via SUBCUTANEOUS

## 2020-01-10 NOTE — Progress Notes (Signed)
All questions were answered by the patient before medication was administered. Have you been hospitalized in the last 10 days? No Do you have a fever? No Do you have a cough? No Do you have a headache or sore throat? No  

## 2020-01-20 ENCOUNTER — Telehealth: Payer: Self-pay | Admitting: Pulmonary Disease

## 2020-01-20 NOTE — Telephone Encounter (Signed)
Xolair Prefilled Syringe Order: 150mg  Prefilled Syringe:  #4 75mg  Prefilled Syringe: #0 Ordered Date: 01/20/2020 Expected date of arrival: 01/23/2020 Ordered by: 03/21/2020, CMA  Specialty Pharmacy: [facility]

## 2020-01-23 NOTE — Telephone Encounter (Signed)
Xolair Prefilled Syringe Received:  150mg  Prefilled Syringe >> quantity #4, lot # , exp date 09/15/2020 75mg  Prefilled Syringe >> quantity #n/a Medication arrival date: 01/23/20 Received by: Faithanne Verret,LPN

## 2020-01-28 ENCOUNTER — Other Ambulatory Visit: Payer: Self-pay

## 2020-01-28 ENCOUNTER — Ambulatory Visit (INDEPENDENT_AMBULATORY_CARE_PROVIDER_SITE_OTHER): Payer: Managed Care, Other (non HMO)

## 2020-01-28 DIAGNOSIS — J4550 Severe persistent asthma, uncomplicated: Secondary | ICD-10-CM | POA: Diagnosis not present

## 2020-01-28 MED ORDER — OMALIZUMAB 150 MG/ML ~~LOC~~ SOSY
300.0000 mg | PREFILLED_SYRINGE | Freq: Once | SUBCUTANEOUS | Status: AC
  Administered 2020-01-28: 17:00:00 300 mg via SUBCUTANEOUS

## 2020-01-28 NOTE — Progress Notes (Signed)
All questions were answered by the patient before medication was administered. Have you been hospitalized in the last 10 days? No Do you have a fever? No Do you have a cough? No Do you have a headache or sore throat? No  

## 2020-02-13 ENCOUNTER — Other Ambulatory Visit: Payer: Self-pay

## 2020-02-13 ENCOUNTER — Ambulatory Visit (INDEPENDENT_AMBULATORY_CARE_PROVIDER_SITE_OTHER): Payer: Managed Care, Other (non HMO)

## 2020-02-13 DIAGNOSIS — J4550 Severe persistent asthma, uncomplicated: Secondary | ICD-10-CM

## 2020-02-13 MED ORDER — OMALIZUMAB 150 MG/ML ~~LOC~~ SOSY
300.0000 mg | PREFILLED_SYRINGE | Freq: Once | SUBCUTANEOUS | Status: AC
  Administered 2020-02-13: 300 mg via SUBCUTANEOUS

## 2020-02-13 NOTE — Progress Notes (Signed)
All questions were answered by the patient before medication was administered. Have you been hospitalized in the last 10 days? No Do you have a fever? No Do you have a cough? No Do you have a headache or sore throat? No  

## 2020-02-17 ENCOUNTER — Telehealth: Payer: Self-pay | Admitting: Pulmonary Disease

## 2020-02-17 NOTE — Telephone Encounter (Signed)
Xolair Prefilled Syringe Order: 150mg  Prefilled Syringe:  #4 75mg  Prefilled Syringe: #n/a Ordered Date: 02/17/20 Expected date of arrival: 02/20/20 Ordered by: Adalid Beckmann,LPN Specialty Pharmacy: 04/18/20

## 2020-02-18 ENCOUNTER — Encounter: Payer: Self-pay | Admitting: Pulmonary Disease

## 2020-02-18 ENCOUNTER — Other Ambulatory Visit: Payer: Self-pay

## 2020-02-18 ENCOUNTER — Ambulatory Visit: Payer: Managed Care, Other (non HMO) | Admitting: Pulmonary Disease

## 2020-02-18 VITALS — BP 122/64 | HR 85 | Temp 98.0°F | Ht 65.0 in | Wt 144.0 lb

## 2020-02-18 DIAGNOSIS — J309 Allergic rhinitis, unspecified: Secondary | ICD-10-CM

## 2020-02-18 DIAGNOSIS — K219 Gastro-esophageal reflux disease without esophagitis: Secondary | ICD-10-CM

## 2020-02-18 DIAGNOSIS — J4550 Severe persistent asthma, uncomplicated: Secondary | ICD-10-CM

## 2020-02-18 MED ORDER — AZELASTINE HCL 0.1 % NA SOLN: 2.0000 | Freq: Two times a day (BID) | NASAL | 2 refills | Status: AC

## 2020-02-18 NOTE — Patient Instructions (Signed)
Continue your medications as they are.  We refilled your azelastine.  We will see you in follow-up in 4 months time call sooner should any new difficulties arise.

## 2020-02-18 NOTE — Progress Notes (Signed)
 Assessment & Plan:  1. Severe persistent asthma without complication (Primary)  2. Allergic rhinitis, unspecified seasonality, unspecified trigger  3. Gastroesophageal reflux disease, unspecified whether esophagitis present   Patient Instructions  Continue your medications as they are.  We refilled your azelastine .  We will see you in follow-up in 4 months time call sooner should any new difficulties arise.    Please note: late entry documentation due to logistical difficulties during COVID-19 pandemic. This note is filed for information purposes only, and is not intended to be used for billing, nor does it represent the full scope/nature of the visit in question. Please see any associated scanned media linked to date of encounter for additional pertinent information.  Subjective:    HPI: Susan Payne is a 39 y.o. female presenting to the pulmonology clinic on 02/18/2020 with report of: Follow-up (no complaints of breathing issues )     Outpatient Encounter Medications as of 02/18/2020  Medication Sig   azelastine  (ASTELIN ) 0.1 % nasal spray Place 2 sprays into both nostrils 2 (two) times daily. Use in each nostril as directed   fluticasone  (FLONASE ) 50 MCG/ACT nasal spray Place 1 spray into both nostrils daily.   [DISCONTINUED] albuterol  (VENTOLIN  HFA) 108 (90 Base) MCG/ACT inhaler Inhale 2 puffs into the lungs every 4 (four) hours as needed for wheezing or shortness of breath.   [DISCONTINUED] azelastine  (ASTELIN ) 0.1 % nasal spray Place 1 spray into both nostrils 2 (two) times daily. Use in each nostril as directed   [DISCONTINUED] budesonide -formoterol  (SYMBICORT ) 160-4.5 MCG/ACT inhaler INHALE 2 INHALATIONS INTO THE LUNGS 2 (TWO) TIMES DAILY   [DISCONTINUED] citalopram (CELEXA) 10 MG tablet Take 30 mg by mouth.    [DISCONTINUED] clonazePAM (KLONOPIN) 1 MG tablet Take 1 mg by mouth as needed for anxiety.   [DISCONTINUED] EPINEPHrine  0.3 mg/0.3 mL IJ SOAJ injection   (Patient not taking: Reported on 09/28/2023)   [DISCONTINUED] famotidine (PEPCID) 10 MG tablet Take 20 mg by mouth 2 (two) times daily.   [DISCONTINUED] Tiotropium Bromide Monohydrate  (SPIRIVA  RESPIMAT) 2.5 MCG/ACT AERS Inhale 2 puffs into the lungs daily.   [DISCONTINUED] XOLAIR  150 MG/ML prefilled syringe Inject 300 mg into the skin every 14 (fourteen) days.   [DISCONTINUED] zafirlukast  (ACCOLATE ) 20 MG tablet Take 1 tablet (20 mg total) by mouth 2 (two) times daily before a meal. (Patient not taking: Reported on 07/07/2020)   [DISCONTINUED] dexlansoprazole (DEXILANT) 60 MG capsule Take by mouth.   [DISCONTINUED] ipratropium (ATROVENT  HFA) 17 MCG/ACT inhaler Inhale 2 puffs into the lungs every 6 (six) hours as needed for wheezing.   [DISCONTINUED] ipratropium (ATROVENT ) 0.02 % nebulizer solution Take 2.5 mLs (0.5 mg total) by nebulization 2 (two) times daily.   [DISCONTINUED] loratadine (CLARITIN) 10 MG tablet Take by mouth.   [DISCONTINUED] predniSONE  (DELTASONE ) 1 MG tablet Take as directed for prednisone  taper (Patient taking differently: 3 mg daily. Take as directed for prednisone  taper)   [DISCONTINUED] sucralfate (CARAFATE) 1 GM/10ML suspension Take 1 g by mouth 2 (two) times daily.   [DISCONTINUED] traMADol  (ULTRAM ) 50 MG tablet Take 1 tablet (50 mg total) by mouth every 8 (eight) hours as needed for moderate pain or severe pain.   No facility-administered encounter medications on file as of 02/18/2020.      Objective:   Vitals:   02/18/20 1030  BP: 122/64  Pulse: 85  Temp: 98 F (36.7 C)  Height: 5' 5 (1.651 m)  Weight: 144 lb (65.3 kg)  SpO2: 98%  TempSrc: Temporal  BMI (Calculated): 23.96     Physical exam documentation is limited by delayed entry of information.

## 2020-02-20 NOTE — Telephone Encounter (Signed)
Xolair Prefilled Syringe Received:  150mg  Prefilled Syringe >> quantity #4, lot # , exp date 11/2020 75mg  Prefilled Syringe >> N/A Medication arrival date: 02/20/2020 Received by: , CMA

## 2020-02-27 ENCOUNTER — Ambulatory Visit: Payer: Managed Care, Other (non HMO)

## 2020-02-28 ENCOUNTER — Other Ambulatory Visit: Payer: Self-pay

## 2020-02-28 ENCOUNTER — Ambulatory Visit (INDEPENDENT_AMBULATORY_CARE_PROVIDER_SITE_OTHER): Payer: Managed Care, Other (non HMO)

## 2020-02-28 DIAGNOSIS — J4550 Severe persistent asthma, uncomplicated: Secondary | ICD-10-CM

## 2020-02-28 MED ORDER — OMALIZUMAB 150 MG/ML ~~LOC~~ SOSY
300.0000 mg | PREFILLED_SYRINGE | Freq: Once | SUBCUTANEOUS | Status: AC
  Administered 2020-02-28: 300 mg via SUBCUTANEOUS

## 2020-02-28 NOTE — Progress Notes (Signed)
Have you been hospitalized within the last 10 days?  No Do you have a fever?  No Do you have a cough?  No Do you have a headache or sore throat? No Do you have your Epi Pen visible and is it within date?  Yes 

## 2020-03-17 ENCOUNTER — Other Ambulatory Visit: Payer: Self-pay

## 2020-03-17 ENCOUNTER — Ambulatory Visit (INDEPENDENT_AMBULATORY_CARE_PROVIDER_SITE_OTHER): Payer: Managed Care, Other (non HMO)

## 2020-03-17 ENCOUNTER — Telehealth: Payer: Self-pay

## 2020-03-17 DIAGNOSIS — J4550 Severe persistent asthma, uncomplicated: Secondary | ICD-10-CM | POA: Diagnosis not present

## 2020-03-17 MED ORDER — OMALIZUMAB 150 MG/ML ~~LOC~~ SOSY
300.0000 mg | PREFILLED_SYRINGE | Freq: Once | SUBCUTANEOUS | Status: AC
  Administered 2020-03-17: 300 mg via SUBCUTANEOUS

## 2020-03-17 NOTE — Telephone Encounter (Signed)
Okay with transitioning to home self administered medication.

## 2020-03-17 NOTE — Progress Notes (Signed)
Have you been hospitalized within the last 10 days?  No Do you have a fever?  No Do you have a cough?  No Do you have a headache or sore throat? No Do you have your Epi Pen visible and is it within date?  Yes 

## 2020-03-17 NOTE — Telephone Encounter (Signed)
Patient came in today for her Xolair injections, and expressed interest in transitioning to auto-injector pen for self-administering.  I explained office policy regarding this medication, and pt expressed understanding.  Dr. Jayme Cloud please advise if you are ok with transitioning pt to home injections.  Thanks!

## 2020-03-17 NOTE — Telephone Encounter (Signed)
Will route to injection pool for follow up 

## 2020-03-18 NOTE — Telephone Encounter (Signed)
Xolair does not come in an auto injecting pen at this time. Will discuss this with the pt at her next injection appointment.

## 2020-03-23 ENCOUNTER — Telehealth: Payer: Self-pay | Admitting: Pulmonary Disease

## 2020-03-23 NOTE — Telephone Encounter (Signed)
Xolair Prefilled Syringe Order: 150mg  Prefilled Syringe:  #4 75mg  Prefilled Syringe: #0 Ordered Date: 03/23/2020 Expected date of arrival: 03/25/2020 Ordered by: 05/23/2020, CMA  Specialty Pharmacy: [facility]

## 2020-03-25 NOTE — Telephone Encounter (Signed)
Xolair Prefilled Syringe Received:  150mg  Prefilled Syringe >> quantity #4, lot # , exp date 01/14/2021 75mg  Prefilled Syringe >> quantity #N/A Medication arrival date: 03/25/20 Received by: Kingslee Mairena,LPN

## 2020-03-31 ENCOUNTER — Ambulatory Visit (INDEPENDENT_AMBULATORY_CARE_PROVIDER_SITE_OTHER): Payer: Managed Care, Other (non HMO)

## 2020-03-31 ENCOUNTER — Other Ambulatory Visit: Payer: Self-pay

## 2020-03-31 DIAGNOSIS — J4550 Severe persistent asthma, uncomplicated: Secondary | ICD-10-CM

## 2020-03-31 MED ORDER — OMALIZUMAB 150 MG/ML ~~LOC~~ SOSY
300.0000 mg | PREFILLED_SYRINGE | Freq: Once | SUBCUTANEOUS | Status: AC
  Administered 2020-03-31: 300 mg via SUBCUTANEOUS

## 2020-03-31 NOTE — Progress Notes (Signed)
Have you been hospitalized within the last 10 days?  No Do you have a fever?  No Do you have a cough?  No Do you have a headache or sore throat? No Do you have your Epi Pen visible and is it within date?  Yes 

## 2020-04-14 ENCOUNTER — Other Ambulatory Visit: Payer: Self-pay

## 2020-04-14 ENCOUNTER — Ambulatory Visit (INDEPENDENT_AMBULATORY_CARE_PROVIDER_SITE_OTHER): Payer: Managed Care, Other (non HMO)

## 2020-04-14 ENCOUNTER — Ambulatory Visit: Payer: Managed Care, Other (non HMO)

## 2020-04-14 DIAGNOSIS — J4550 Severe persistent asthma, uncomplicated: Secondary | ICD-10-CM | POA: Diagnosis not present

## 2020-04-14 MED ORDER — OMALIZUMAB 150 MG/ML ~~LOC~~ SOSY
300.0000 mg | PREFILLED_SYRINGE | Freq: Once | SUBCUTANEOUS | Status: AC
  Administered 2020-04-14: 300 mg via SUBCUTANEOUS

## 2020-04-14 NOTE — Progress Notes (Signed)
All questions were answered by the patient before medication was administered. Have you been hospitalized in the last 10 days? No Do you have a fever? No Do you have a cough? No Do you have a headache or sore  throat? No   Patient has been trained on how to self administer Xolair at home. Technique was shown with a demonstrator prefilled syringe several times to make patient comfortable with technique. Patient was then asked to demonstrate the technique back to me. Technique was performed properly by the patient. Patient will call if they have an issues with their first time administering at home.

## 2020-04-27 ENCOUNTER — Ambulatory Visit: Payer: Managed Care, Other (non HMO)

## 2020-04-27 ENCOUNTER — Ambulatory Visit
Admission: RE | Admit: 2020-04-27 | Discharge: 2020-04-27 | Disposition: A | Discharge status: Home / Self Care | Payer: Managed Care, Other (non HMO) | Source: Ambulatory Visit | Attending: Family Medicine | Admitting: Family Medicine

## 2020-04-27 ENCOUNTER — Other Ambulatory Visit: Payer: Self-pay

## 2020-04-27 VITALS — BP 141/91 | HR 73 | Temp 98.4°F | Resp 18 | Ht 65.0 in | Wt 140.0 lb

## 2020-04-27 DIAGNOSIS — Z79899 Other long term (current) drug therapy: Secondary | ICD-10-CM | POA: Diagnosis not present

## 2020-04-27 DIAGNOSIS — Z87891 Personal history of nicotine dependence: Secondary | ICD-10-CM | POA: Insufficient documentation

## 2020-04-27 DIAGNOSIS — Z20822 Contact with and (suspected) exposure to covid-19: Secondary | ICD-10-CM | POA: Diagnosis not present

## 2020-04-27 DIAGNOSIS — F419 Anxiety disorder, unspecified: Secondary | ICD-10-CM | POA: Diagnosis not present

## 2020-04-27 DIAGNOSIS — G4700 Insomnia, unspecified: Secondary | ICD-10-CM | POA: Insufficient documentation

## 2020-04-27 DIAGNOSIS — J4530 Mild persistent asthma, uncomplicated: Secondary | ICD-10-CM | POA: Insufficient documentation

## 2020-04-27 DIAGNOSIS — F329 Major depressive disorder, single episode, unspecified: Secondary | ICD-10-CM | POA: Diagnosis not present

## 2020-04-27 DIAGNOSIS — J069 Acute upper respiratory infection, unspecified: Secondary | ICD-10-CM | POA: Diagnosis present

## 2020-04-27 DIAGNOSIS — K2100 Gastro-esophageal reflux disease with esophagitis, without bleeding: Secondary | ICD-10-CM | POA: Diagnosis not present

## 2020-04-27 DIAGNOSIS — Z7951 Long term (current) use of inhaled steroids: Secondary | ICD-10-CM | POA: Diagnosis not present

## 2020-04-27 LAB — SARS CORONAVIRUS 2 (TAT 6-24 HRS): SARS Coronavirus 2: NEGATIVE

## 2020-04-27 MED ORDER — HYDROCOD POLST-CPM POLST ER 10-8 MG/5ML PO SUER: 5.0000 mL | Freq: Every evening | ORAL | 0 refills | Status: DC | PRN

## 2020-04-27 NOTE — ED Triage Notes (Signed)
Pt states she caught a cold from her son and had sore throat (resolved), coughing up brown phlegm. Now concerned for bronchitis. Her son had negative COVID test. Feels like "ground glass" in her lungs

## 2020-04-27 NOTE — ED Provider Notes (Signed)
[facility]    CSN: [id number] Arrival date & time: 04/27/20  7829      History   Chief Complaint Chief Complaint  Patient presents with  . Cough    HPI Susan Payne is a 39 y.o. female.   39 yo female with a c/o cough for the past 5-6 days. States she caught "a cold" from her son. Initially had sore throat but now that's resolved. Denies any fevers, chills, wheezing, shortness of breath. States has a h/o asthma and has been using her inhalers.   Cough   Past Medical History:  Diagnosis Date  . Anxiety   . Asthma   . Reflux esophagitis     Patient Active Problem List   Diagnosis Date Noted  . 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 Vocal cord dysfunction 09/16/2019  . Environmental allergies 09/16/2019  . Depression 06/25/2019  . GERD (gastroesophageal reflux disease) 06/25/2019  . Mild persistent asthma 05/10/2016  . Allergic rhinitis 04/09/2014  . Healthcare maintenance 08/24/2013  . Anxiety 04/24/2013  . Asthma without status asthmaticus 02/25/2013  . Insomnia 02/25/2013    Past Surgical History:  Procedure Laterality Date  . CESAREAN SECTION      OB History   No obstetric history on file.      Home Medications    Prior to Admission medications   Medication Sig Start Date End Date Taking? Authorizing Provider  albuterol (VENTOLIN HFA) 108 (90 Base) MCG/ACT inhaler Inhale 2 puffs into the lungs every 4 (four) hours as needed for wheezing or shortness of breath. 11/29/19   Salena Saner, MD  azelastine (ASTELIN) 0.1 % nasal spray Place 2 sprays into both nostrils 2 (two) times daily. Use in each nostril as directed 02/18/20   Salena Saner, MD  budesonide-formoterol (SYMBICORT) 160-4.5 MCG/ACT inhaler INHALE 2 INHALATIONS INTO THE LUNGS 2 (TWO) TIMES DAILY 07/18/18   [provider]  busPIRone (BUSPAR) 5 MG tablet TAKE 1 TABLET BY MOUTH THREE TIMES DAILY. INCREASE BY 1 TABLET EVERY 3 DAYS UNTIL TAKING 2 TABLETS 3 TIMES PER DAY  03/14/20   [provider]  chlorpheniramine-HYDROcodone (TUSSIONEX PENNKINETIC ER) 10-8 MG/5ML SUER Take 5 mLs by mouth at bedtime as needed. 04/27/20   Payton Mccallum, MD  citalopram (CELEXA) 10 MG tablet Take 30 mg by mouth.     [provider]  clonazePAM (KLONOPIN) 1 MG tablet Take 1 mg by mouth as needed for anxiety.    [provider]  dexlansoprazole (DEXILANT) 60 MG capsule Take by mouth. 07/09/19 08/08/19  [provider]  EPINEPHrine 0.3 mg/0.3 mL IJ SOAJ injection  03/21/19   [provider]  famotidine (PEPCID) 10 MG tablet Take 20 mg by mouth 2 (two) times daily.    [provider]  fluticasone (FLONASE) 50 MCG/ACT nasal spray Place 1 spray into both nostrils daily.    [provider]  LORazepam (ATIVAN) 0.5 MG tablet SMARTSIG:0.5-1 Tablet(s) By Mouth 1 to 2 Times Daily 03/14/20   [provider]  sucralfate (CARAFATE) 1 GM/10ML suspension Take 1 g by mouth 2 (two) times daily. 10/15/19   [provider]  Tiotropium Bromide Monohydrate (SPIRIVA RESPIMAT) 2.5 MCG/ACT AERS Inhale 2 puffs into the lungs daily. 11/18/19   Salena Saner, MD  traMADol (ULTRAM) 50 MG tablet Take 1 tablet (50 mg total) by mouth every 8 (eight) hours as needed for moderate pain or severe pain. Patient not taking: Reported on 02/18/2020 12/09/19   Tommie Sams,  DO  XOLAIR 150 MG/ML prefilled syringe Inject 300 mg into the skin every 14 (fourteen) days. 07/24/19   Salena Saner, MD  zafirlukast (ACCOLATE) 20 MG tablet Take 1 tablet (20 mg total) by mouth 2 (two) times daily before a meal. 11/29/19   Salena Saner, MD  ipratropium (ATROVENT HFA) 17 MCG/ACT inhaler Inhale 2 puffs into the lungs every 6 (six) hours as needed for wheezing. 11/29/19 12/09/19  Salena Saner, MD  ipratropium (ATROVENT) 0.02 % nebulizer solution Take 2.5 mLs (0.5 mg total) by nebulization 2 (two) times daily. 12/29/18 12/09/19  Domenick Gong, MD    loratadine (CLARITIN) 10 MG tablet Take by mouth.  12/09/19  [provider]    Family History Family History  Problem Relation Age of Onset  . Asthma Mother     Social History Social History   Tobacco Use  . Smoking status: Former Smoker    Packs/day: 1.00    Years: 18.00    Pack years: 18.00    Types: Cigarettes    Quit date: 2016    Years since quitting: 5.5  . Smokeless tobacco: Never Used  Vaping Use  . Vaping Use: Never used  Substance Use Topics  . Alcohol use: Yes    Comment: occasional   . Drug use: Never     Allergies   Cat hair extract, Dog epithelium allergy skin test, and Molds & smuts   Review of Systems Review of Systems  Respiratory: Positive for cough.      Physical Exam Triage Vital Signs ED Triage Vitals  Enc Vitals Group     BP 04/27/20 1013 (!) 141/91     Pulse Rate 04/27/20 1013 73     Resp 04/27/20 1013 18     Temp 04/27/20 1013 98.4 F (36.9 C)     Temp Source 04/27/20 1013 Oral     SpO2 04/27/20 1013 100 %     Weight 04/27/20 1012 140 lb (63.5 kg)     Height 04/27/20 1012 5\' 5"  (1.651 m)     Head Circumference --      Peak Flow --      Pain Score 04/27/20 1011 4     Pain Loc --      Pain Edu? --      Excl. in GC? --    No data found.  Updated Vital Signs BP (!) 141/91 (BP Location: Right Arm)   Pulse 73   Temp 98.4 F (36.9 C) (Oral)   Resp 18   Ht 5\' 5"  (1.651 m)   Wt 63.5 kg   LMP 04/10/2020   SpO2 100%   BMI 23.30 kg/m   Visual Acuity Right Eye Distance:   Left Eye Distance:   Bilateral Distance:    Right Eye Near:   Left Eye Near:    Bilateral Near:     Physical Exam Vitals and nursing note reviewed.  Constitutional:      General: She is not in acute distress.    Appearance: She is not toxic-appearing or diaphoretic.  HENT:     Right Ear: Tympanic membrane normal.     Left Ear: Tympanic membrane normal.  Cardiovascular:     Rate and Rhythm: Normal rate.     Heart sounds: Normal heart  sounds.  Pulmonary:     Effort: Pulmonary effort is normal. No respiratory distress.     Breath sounds: Normal breath sounds. No stridor. No wheezing, rhonchi or rales.  Neurological:  Mental Status: She is alert.      UC Treatments / Results  Labs (all labs ordered are listed, but only abnormal results are displayed) Labs Reviewed  SARS CORONAVIRUS 2 (TAT 6-24 HRS)    EKG   Radiology No results found.  Procedures Procedures (including critical care time)  Medications Ordered in UC Medications - No data to display  Initial Impression / Assessment and Plan / UC Course  I have reviewed the triage vital signs and the nursing notes.  Pertinent labs & imaging results that were available during my care of the patient were reviewed by me and considered in my medical decision making (see chart for details).      Final Clinical Impressions(s) / UC Diagnoses   Final diagnoses:  Viral URI with cough    ED Prescriptions    Medication Sig Dispense Auth. Provider   chlorpheniramine-HYDROcodone (TUSSIONEX PENNKINETIC ER) 10-8 MG/5ML SUER Take 5 mLs by mouth at bedtime as needed. 60 mL Payton Mccallum, MD      1. diagnosis reviewed with patient 2. rx as per orders above; reviewed possible side effects, interactions, risks and benefits  3. Recommend continue current inhalers 4. Follow-up prn if symptoms worsen or don't improve   PDMP not reviewed this encounter.   Payton Mccallum, MD 04/27/20 1053

## 2020-04-29 ENCOUNTER — Ambulatory Visit: Payer: Managed Care, Other (non HMO) | Admitting: Pulmonary Disease

## 2020-05-25 ENCOUNTER — Other Ambulatory Visit: Payer: Self-pay

## 2020-05-25 MED ORDER — SPIRIVA RESPIMAT 2.5 MCG/ACT IN AERS: 2.0000 | INHALATION_SPRAY | Freq: Every day | RESPIRATORY_TRACT | 6 refills | Status: DC

## 2020-05-25 NOTE — Telephone Encounter (Signed)
Rx for spiriva 2.5 has been sent to preferred pharmacy.

## 2020-07-07 ENCOUNTER — Encounter: Payer: Self-pay | Admitting: Pulmonary Disease

## 2020-07-07 ENCOUNTER — Ambulatory Visit (INDEPENDENT_AMBULATORY_CARE_PROVIDER_SITE_OTHER): Payer: Managed Care, Other (non HMO) | Admitting: Pulmonary Disease

## 2020-07-07 ENCOUNTER — Other Ambulatory Visit: Payer: Self-pay

## 2020-07-07 VITALS — BP 126/76 | HR 71 | Temp 97.3°F | Ht 65.0 in | Wt 133.2 lb

## 2020-07-07 DIAGNOSIS — R768 Other specified abnormal immunological findings in serum: Secondary | ICD-10-CM

## 2020-07-07 DIAGNOSIS — J0141 Acute recurrent pansinusitis: Secondary | ICD-10-CM | POA: Diagnosis not present

## 2020-07-07 DIAGNOSIS — J309 Allergic rhinitis, unspecified: Secondary | ICD-10-CM | POA: Diagnosis not present

## 2020-07-07 DIAGNOSIS — J4550 Severe persistent asthma, uncomplicated: Secondary | ICD-10-CM | POA: Diagnosis not present

## 2020-07-07 DIAGNOSIS — K219 Gastro-esophageal reflux disease without esophagitis: Secondary | ICD-10-CM | POA: Diagnosis not present

## 2020-07-07 MED ORDER — AZITHROMYCIN 250 MG PO TABS: ORAL_TABLET | ORAL | 0 refills | Status: AC

## 2020-07-07 NOTE — Patient Instructions (Signed)
We have sent in a prescription for a Azithromycin to treat the rest of your sinus infection.  We will see him in follow-up in 4 to 6 months time call sooner should any new difficulties arise.

## 2020-07-07 NOTE — Progress Notes (Signed)
Subjective:    Patient ID: Susan Payne, female    DOB: 1980-10-18, 39 y.o.   MRN: [id number]  HPI Susan Payne is a 39 year old remote former smoker with a history of severe persistent asthma and elevated IgE, who presents for follow-up on the same.  She had been dependent on prednisone but has now been successfully weaned off.  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  All of these issues are resolving.  She is currently maintained on Symbicort, Spiriva and Xolair.  She uses rescue inhaler of albuterol rarely.  Occasionally has to use ipratropium inhaler to control bouts of cough but this is becoming more and more rare.  She reports that she is doing well with the exception of having had some sinus infection issues.  She is to make an appointment with ENT for follow-up.  With regards to her lungs however she states that she is doing well.  She has not had any fevers, chills or sweats.  No wheezing.  Dyspnea has resolved.  She feels well and looks well.  She is up to date on COVID-19 vaccine.  Review of Systems A 10 point review of systems was performed and it is as noted above otherwise negative.  Allergies  Allergen Reactions  . Cat Hair Extract Shortness Of Breath  . Dog Epithelium Allergy Skin Test Shortness Of Breath  . Molds & Smuts Shortness Of Breath   Current Meds  Medication Sig  . albuterol (VENTOLIN HFA) 108 (90 Base) MCG/ACT inhaler Inhale 2 puffs into the lungs every 4 (four) hours as needed for wheezing or shortness of breath.  Marland Kitchen azelastine (ASTELIN) 0.1 % nasal spray Place 2 sprays into both nostrils 2 (two) times daily. Use in each nostril as directed  . budesonide-formoterol (SYMBICORT) 160-4.5 MCG/ACT inhaler INHALE 2 INHALATIONS INTO THE LUNGS 2 (TWO) TIMES DAILY  . busPIRone (BUSPAR) 5 MG tablet TAKE 1 TABLET BY MOUTH THREE TIMES DAILY. INCREASE BY 1 TABLET EVERY 3 DAYS UNTIL TAKING 2 TABLETS 3 TIMES PER DAY   . citalopram (CELEXA) 10 MG tablet Take 30 mg by mouth.   . EPINEPHrine 0.3 mg/0.3 mL IJ SOAJ injection   . famotidine (PEPCID) 10 MG tablet Take 20 mg by mouth 2 (two) times daily.  . fluticasone (FLONASE) 50 MCG/ACT nasal spray Place 1 spray into both nostrils daily.  Marland Kitchen LORazepam (ATIVAN) 0.5 MG tablet SMARTSIG:0.5-1 Tablet(s) By Mouth 1 to 2 Times Daily  . Tiotropium Bromide Monohydrate (SPIRIVA RESPIMAT) 2.5 MCG/ACT AERS Inhale 2 puffs into the lungs daily.  Geoffry Paradise 150 MG/ML prefilled syringe Inject 300 mg into the skin every 14 (fourteen) days.  . [DISCONTINUED] chlorpheniramine-HYDROcodone (TUSSIONEX PENNKINETIC ER) 10-8 MG/5ML SUER Take 5 mLs by mouth at bedtime as needed.  . [DISCONTINUED] clonazePAM (KLONOPIN) 1 MG tablet Take 1 mg by mouth as needed for anxiety.  . [DISCONTINUED] sucralfate (CARAFATE) 1 GM/10ML suspension Take 1 g by mouth 2 (two) times daily.  . [DISCONTINUED] traMADol (ULTRAM) 50 MG tablet Take 1 tablet (50 mg total) by mouth every 8 (eight) hours as needed for moderate pain or severe pain.   Immunization History  Administered Date(s) Administered  . Influenza, Seasonal, Injecte, Preservative Fre 08/23/2013  . Influenza,inj,Quad PF,6+ Mos 07/01/2014, 07/19/2017, 10/15/2019  . Influenza-Unspecified 07/01/2014  . Moderna SARS-COVID-2 Vaccination 12/18/2019, 01/14/2020  . Pneumococcal Polysaccharide-23 04/24/2013  . Tdap 05/13/2016       Objective:   Physical Exam BP 126/76 (BP  Location: Left Arm, Cuff Size: Normal)   Pulse 71   Temp (!) 97.3 F (36.3 C) (Temporal)   Ht 5\' 5"  (1.651 m)   Wt 133 lb 3.2 oz (60.4 kg)   SpO2 99%   BMI 22.17 kg/m   GENERAL: Well-developed well-nourished, has lost cushingoid features.  Fully ambulatory, no distress. HEAD: Normocephalic, atraumatic.  EYES: Pupils equal, round, reactive to light.  No scleral icterus.  MOUTH: Nose/mouth/throat not examined due to masking requirements for COVID 19. NECK: Supple. No  thyromegaly. Trachea midline. No JVD.  No adenopathy. PULMONARY: Good air entry bilaterally.  No adventitious sounds. CARDIOVASCULAR: S1 and S2. Regular rate and rhythm.  No rubs, murmurs or gallops heard. ABDOMEN: Benign. MUSCULOSKELETAL: No joint deformity, no clubbing, no edema.  NEUROLOGIC: No focal deficits.  Fully ambulatory.  Speech is fluent. SKIN: Intact,warm,dry.  Limited exam shows no rashes. PSYCH: Mood and behavior normal.      Assessment & Plan:     ICD-10-CM   1. Severe persistent asthma without complication  J45.50    Very well controlled with Symbicort, Spiriva and Xolair Very little need for rescue inhalation OFF OF STEROIDS!  2. Allergic rhinitis, unspecified seasonality, unspecified trigger  J30.9    Continue nasal hygiene  3. Acute recurrent pansinusitis  J01.41    Azithromycin Z-Pak  4. Gastroesophageal reflux disease, unspecified whether esophagitis present  K21.9    Continue antireflux measures, continue Dexilant  5. Elevated IgE level  R76.8    Managed with Xolair   Discussion:  Susan Payne is doing really well.  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  She is recovering well.  She is now doing Xolair self injections.  Continue the same.  We are treating a mild sinusitis flareup with Azithromycin.  She is to follow-up in 4 to 6 months time she is to contact Florene Route prior to that time should any new difficulties arise.   [place], MD [facility]   *This note was dictated using voice recognition software/Dragon.  Despite best efforts to proofread, errors can occur which can change the meaning.  Any change was purely unintentional.

## 2020-07-16 ENCOUNTER — Other Ambulatory Visit: Payer: Self-pay | Admitting: Pulmonary Disease

## 2020-08-03 ENCOUNTER — Ambulatory Visit: Payer: Managed Care, Other (non HMO) | Admitting: Pulmonary Disease

## 2020-08-05 ENCOUNTER — Other Ambulatory Visit: Payer: Self-pay | Admitting: Pulmonary Disease

## 2020-08-21 ENCOUNTER — Ambulatory Visit
Admission: RE | Admit: 2020-08-21 | Discharge: 2020-08-21 | Disposition: A | Discharge status: Home / Self Care | Payer: Managed Care, Other (non HMO) | Source: Ambulatory Visit | Attending: Internal Medicine | Admitting: Internal Medicine

## 2020-08-21 ENCOUNTER — Other Ambulatory Visit: Payer: Self-pay

## 2020-08-21 VITALS — BP 140/93 | HR 72 | Resp 15 | Ht 65.0 in | Wt 130.0 lb

## 2020-08-21 DIAGNOSIS — M5412 Radiculopathy, cervical region: Secondary | ICD-10-CM

## 2020-08-21 DIAGNOSIS — M62838 Other muscle spasm: Secondary | ICD-10-CM | POA: Diagnosis not present

## 2020-08-21 MED ORDER — CYCLOBENZAPRINE HCL 10 MG PO TABS: 10.0000 mg | ORAL_TABLET | Freq: Two times a day (BID) | ORAL | 0 refills | Status: DC | PRN

## 2020-08-21 NOTE — ED Triage Notes (Signed)
Patient complains of left shoulder pain that started on Wednesday evening. Denies known injury but states that she did pickup a heavy bag of water bottles on Tuesday that she is unsure if this triggered the pain.

## 2020-08-21 NOTE — Discharge Instructions (Addendum)
Alternate ice and heat and after the heat do the stretches I taught you.  Continue the Ibuprofen and Tylenol alternation for pain as needed.

## 2020-08-21 NOTE — ED Provider Notes (Signed)
[facility]    CSN: [id number] Arrival date & time: 08/21/20  1258      History   Chief Complaint Chief Complaint  Patient presents with  . Shoulder Pain    left    HPI Susan Payne is a 39 y.o. female who presents with L shoulder pain x 2 days. She picked up a heavy bags of water bottles the day before, and is unsure if this is what provoked it, since she does not recall an emediate pain. The next am felt pain on posterior shoulder, trap area and has been radiating pain down her arm to index and middle finger. Denies weakness or paresthesia. Denies past hx of cervical disc disease or injury.  She has used heat, taken tylenol and motrin and has not helped the pain. She found some old Flexeril which she only had 2 left and took them and has helped the best.     Past Medical History:  Diagnosis Date  . Anxiety   . Asthma   . Reflux esophagitis     Patient Active Problem List   Diagnosis Date Noted  . 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 Vocal cord dysfunction 09/16/2019  . Environmental allergies 09/16/2019  . Depression 06/25/2019  . GERD (gastroesophageal reflux disease) 06/25/2019  . Mild persistent asthma 05/10/2016  . Allergic rhinitis 04/09/2014  . Healthcare maintenance 08/24/2013  . Anxiety 04/24/2013  . Asthma without status asthmaticus 02/25/2013  . Insomnia 02/25/2013    Past Surgical History:  Procedure Laterality Date  . CESAREAN SECTION      OB History   No obstetric history on file.      Home Medications    Prior to Admission medications   Medication Sig Start Date End Date Taking? Authorizing Provider  albuterol (VENTOLIN HFA) 108 (90 Base) MCG/ACT inhaler INHALE 2 PUFFS INTO THE LUNGS EVERY 4 HOURS AS NEEDED FOR WHEEZING OR SHORTNESS OF BREATH 07/16/20  Yes Salena Saner, MD  azelastine (ASTELIN) 0.1 % nasal spray Place 2 sprays into both nostrils 2 (two) times daily. Use in each nostril as directed 02/18/20  Yes Salena Saner, MD  budesonide-formoterol (SYMBICORT) 160-4.5 MCG/ACT inhaler INHALE 2 INHALATIONS INTO THE LUNGS 2 (TWO) TIMES DAILY 07/18/18  Yes [provider]  busPIRone (BUSPAR) 5 MG tablet TAKE 1 TABLET BY MOUTH THREE TIMES DAILY. INCREASE BY 1 TABLET EVERY 3 DAYS UNTIL TAKING 2 TABLETS 3 TIMES PER DAY 03/14/20  Yes [provider]  citalopram (CELEXA) 10 MG tablet Take 30 mg by mouth.    Yes [provider]  dexlansoprazole (DEXILANT) 60 MG capsule Take by mouth. 07/09/19 08/21/20 Yes [provider]  EPINEPHrine 0.3 mg/0.3 mL IJ SOAJ injection  03/21/19  Yes [provider]  famotidine (PEPCID) 10 MG tablet Take 20 mg by mouth 2 (two) times daily.   Yes [provider]  fluticasone (FLONASE) 50 MCG/ACT nasal spray Place 1 spray into both nostrils daily.   Yes [provider]  LORazepam (ATIVAN) 0.5 MG tablet SMARTSIG:0.5-1 Tablet(s) By Mouth 1 to 2 Times Daily 03/14/20  Yes [provider]  Tiotropium Bromide Monohydrate (SPIRIVA RESPIMAT) 2.5 MCG/ACT AERS Inhale 2 puffs into the lungs daily. 05/25/20  Yes Salena Saner, MD  Geoffry Paradise 150 MG/ML prefilled syringe INJECT 300MG  SUBCUTANEOUSLY EVERY 2 WEEKS (GIVEN AT MD  OFFICE, DISCARD UNUSED) 08/05/20  Yes 08/07/20, MD  zafirlukast (ACCOLATE) 20 MG tablet Take 1 tablet (20 mg total) by mouth 2 (  two) times daily before a meal. Patient not taking: Reported on 07/07/2020 11/29/19   Salena Saner, MD  ipratropium (ATROVENT HFA) 17 MCG/ACT inhaler Inhale 2 puffs into the lungs every 6 (six) hours as needed for wheezing. 11/29/19 12/09/19  Salena Saner, MD  ipratropium (ATROVENT) 0.02 % nebulizer solution Take 2.5 mLs (0.5 mg total) by nebulization 2 (two) times daily. 12/29/18 12/09/19  Domenick Gong, MD  loratadine (CLARITIN) 10 MG tablet Take by mouth.  12/09/19  [provider]    Family History Family History  Problem Relation Age of Onset  . Asthma Mother      Social History Social History   Tobacco Use  . Smoking status: Former Smoker    Packs/day: 1.00    Years: 18.00    Pack years: 18.00    Types: Cigarettes    Quit date: 2016    Years since quitting: 5.8  . Smokeless tobacco: Never Used  Vaping Use  . Vaping Use: Never used  Substance Use Topics  . Alcohol use: Yes    Comment: occasional   . Drug use: Never     Allergies   Cat hair extract, Dog epithelium allergy skin test, and Molds & smuts   Review of Systems Review of Systems  Musculoskeletal: Positive for arthralgias and neck pain. Negative for gait problem and joint swelling.  Skin: Negative for color change and rash.  Neurological: Negative for numbness.     Physical Exam Triage Vital Signs ED Triage Vitals  Enc Vitals Group     BP 08/21/20 1313 (!) 155/89     Pulse Rate 08/21/20 1313 83     Resp 08/21/20 1313 15     Temp --      Temp Source 08/21/20 1313 Oral     SpO2 08/21/20 1313 99 %     Weight 08/21/20 1310 130 lb (59 kg)     Height 08/21/20 1310 5\' 5"  (1.651 m)     Head Circumference --      Peak Flow --      Pain Score 08/21/20 1310 9     Pain Loc --      Pain Edu? --      Excl. in GC? --    No data found.  Updated Vital Signs BP (!) 155/89 (BP Location: Right Arm)   Pulse 83   Resp 15   Ht 5\' 5"  (1.651 m)   Wt 130 lb (59 kg)   LMP 08/12/2020   SpO2 99%   BMI 21.63 kg/m   Visual Acuity Right Eye Distance:   Left Eye Distance:   Bilateral Distance:    Right Eye Near:   Left Eye Near:    Bilateral Near:     Physical Exam Vitals and nursing note reviewed.  Constitutional:      General: She is not in acute distress.    Appearance: She is normal weight. She is not toxic-appearing.  HENT:     Head: Normocephalic.     Right Ear: External ear normal.     Left Ear: External ear normal.  Eyes:     General: No scleral icterus.    Conjunctiva/sclera: Conjunctivae normal.  Neck:     Comments: Has tense L and tender Trapezius  and Rhomboid Pulmonary:     Effort: Pulmonary effort is normal.  Musculoskeletal:        General: Normal range of motion.     Cervical back: Neck supple. No rigidity.  Comments: L SHOULDER- with normal ROM and not tender with ROM. Felt a little muscle soreness on the proximal tricep region   Lymphadenopathy:     Cervical: No cervical adenopathy.  Skin:    General: Skin is warm and dry.     Findings: No bruising, erythema or rash.  Neurological:     Mental Status: She is alert and oriented to person, place, and time.     Motor: No weakness.     Gait: Gait normal.     Deep Tendon Reflexes: Reflexes normal.  Psychiatric:        Mood and Affect: Mood normal.        Behavior: Behavior normal.        Thought Content: Thought content normal.        Judgment: Judgment normal.     UC Treatments / Results  Labs (all labs ordered are listed, but only abnormal results are displayed) Labs Reviewed - No data to display  EKG   Radiology No results found.  Procedures Procedures (including critical care time)  Medications Ordered in UC Medications - No data to display  Initial Impression / Assessment and Plan / UC Course  I have reviewed the triage vital signs and the nursing notes. Has left trapezius and rhomboid strain with spasm. She may continue the Motrin and I placed her on Flexeril as noted. See instructions.  Needs to Fu with PCP if pain continues to radiate to L arm.  Final Clinical Impressions(s) / UC Diagnoses   Final diagnoses:  None   Discharge Instructions   None    ED Prescriptions    None     PDMP not reviewed this encounter.   Garey Ham, PA-C 08/21/20 1438

## 2020-09-14 MED ORDER — BUDESONIDE-FORMOTEROL FUMARATE 160-4.5 MCG/ACT IN AERO: INHALATION_SPRAY | RESPIRATORY_TRACT | 6 refills | Status: DC

## 2020-12-21 ENCOUNTER — Other Ambulatory Visit: Payer: Self-pay

## 2020-12-21 MED ORDER — SPIRIVA RESPIMAT 2.5 MCG/ACT IN AERS: 2.0000 | INHALATION_SPRAY | Freq: Every day | RESPIRATORY_TRACT | 6 refills | Status: DC

## 2021-02-16 ENCOUNTER — Other Ambulatory Visit: Payer: Self-pay

## 2021-02-16 ENCOUNTER — Other Ambulatory Visit: Payer: Self-pay | Admitting: Pulmonary Disease

## 2021-02-16 ENCOUNTER — Ambulatory Visit (INDEPENDENT_AMBULATORY_CARE_PROVIDER_SITE_OTHER): Payer: Managed Care, Other (non HMO) | Admitting: Pulmonary Disease

## 2021-02-16 ENCOUNTER — Encounter: Payer: Self-pay | Admitting: Pulmonary Disease

## 2021-02-16 VITALS — BP 130/78 | HR 79 | Temp 97.7°F | Ht 64.0 in | Wt 119.4 lb

## 2021-02-16 DIAGNOSIS — J0101 Acute recurrent maxillary sinusitis: Secondary | ICD-10-CM | POA: Diagnosis not present

## 2021-02-16 DIAGNOSIS — J4550 Severe persistent asthma, uncomplicated: Secondary | ICD-10-CM

## 2021-02-16 DIAGNOSIS — J342 Deviated nasal septum: Secondary | ICD-10-CM | POA: Diagnosis not present

## 2021-02-16 MED ORDER — CEFUROXIME AXETIL 250 MG PO TABS: 250.0000 mg | ORAL_TABLET | Freq: Two times a day (BID) | ORAL | 0 refills | Status: AC

## 2021-02-16 NOTE — Patient Instructions (Signed)
We have written a prescription for Ceftin for 10 days  Make sure you take probiotics and eat yogurt while you are taking the Ceftin  We are scheduling a CT of the sinuses  We will seeyou in follow-up in 4 to 6 weeks time call sooner should any new problems arise you may see me or the nurse practitioner

## 2021-02-16 NOTE — Progress Notes (Signed)
Subjective:    Patient ID: Susan Payne, female    DOB: 06-16-1981, 40 y.o.   MRN: [id number]  Chief Complaint  Patient presents with  . Follow-up    Breathing is well, occasional wheeze with allergy season states she's feeling better now. Nasal congestion and head congestion.      HPI Susan Payne is a 40 year old remote former smoker with less than 20-pack-year history of smoking who presents for follow-up of severe persistent asthma and elevated IgE.  Currently on Xolair for management, she is administering this herself at home.  She presents today for a follow-up.  This is a scheduled visit.  She has noted increased difficulties with sinus congestion and purulent nasal discharge.  She does have a deviated septum to the right which causes her issues with recurrent sinusitis.  He has a pending appointment with ENT.  She has had 2 rounds of antibiotics with persistent symptoms of sinusitis.  She is avoiding systemic steroids as she was on prolonged course of steroids while her asthma was being controlled.  She has now been weaned off of them since started Xolair.  She continues to be on Symbicort,Spiriva and Accolate with reasonable control of her symptoms.  We discussed getting her off Spiriva however she is leery about this as she tried previously but had recurrent symptoms.  She does not endorse any other symptoms today except for as noted.  She does get subjectively febrile when her sinuses flareup but has not had any rigors or night sweats.  No chest pain.  No lower extremity edema, and orthopnea or paroxysmal nocturnal dyspnea.  She has had no issues with Xolair self injection.   Review of Systems A 10 point review of systems was performed and it is as noted above otherwise negative.  Patient Active Problem List   Diagnosis Date Noted  . 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 Vocal cord dysfunction 09/16/2019  . Environmental allergies 09/16/2019  . Depression 06/25/2019  . GERD  (gastroesophageal reflux disease) 06/25/2019  . Mild persistent asthma 05/10/2016  . Allergic rhinitis 04/09/2014  . Healthcare maintenance 08/24/2013  . Anxiety 04/24/2013  . Asthma without status asthmaticus 02/25/2013  . Insomnia 02/25/2013   Social History   Tobacco Use  . Smoking status: Former Smoker    Packs/day: 1.00    Years: 18.00    Pack years: 18.00    Types: Cigarettes    Quit date: 2016    Years since quitting: 6.3  . Smokeless tobacco: Never Used  Substance Use Topics  . Alcohol use: Yes    Comment: occasional    Allergies  Allergen Reactions  . Cat Hair Extract Shortness Of Breath  . Dog Epithelium Allergy Skin Test Shortness Of Breath  . Molds & Smuts Shortness Of Breath   Current Meds  Medication Sig  . albuterol (VENTOLIN HFA) 108 (90 Base) MCG/ACT inhaler INHALE 2 PUFFS INTO THE LUNGS EVERY 4 HOURS AS NEEDED FOR WHEEZING OR SHORTNESS OF BREATH  . azelastine (ASTELIN) 0.1 % nasal spray Place 2 sprays into both nostrils 2 (two) times daily. Use in each nostril as directed  . budesonide-formoterol (SYMBICORT) 160-4.5 MCG/ACT inhaler INHALE 2 INHALATIONS INTO THE LUNGS 2 (TWO) TIMES DAILY  . cefUROXime (CEFTIN) 250 MG tablet Take 1 tablet (250 mg total) by mouth 2 (two) times daily with a meal for 10 days.  . citalopram (CELEXA) 10 MG tablet Take 30 mg by mouth.   . EPINEPHrine 0.3 mg/0.3 mL IJ SOAJ injection   .  famotidine (PEPCID) 10 MG tablet Take 20 mg by mouth 2 (two) times daily.  . fluticasone (FLONASE) 50 MCG/ACT nasal spray Place 1 spray into both nostrils daily.  Marland Kitchen LORazepam (ATIVAN) 0.5 MG tablet SMARTSIG:0.5-1 Tablet(s) By Mouth 1 to 2 Times Daily  . sertraline (ZOLOFT) 50 MG tablet Take 50 mg by mouth daily.  . Tiotropium Bromide Monohydrate (SPIRIVA RESPIMAT) 2.5 MCG/ACT AERS Inhale 2 puffs into the lungs daily.  Geoffry Paradise 150 MG/ML prefilled syringe INJECT 300MG  SUBCUTANEOUSLY EVERY 2 WEEKS (GIVEN AT MD  OFFICE, DISCARD UNUSED)   Immunization  History  Administered Date(s) Administered  . Influenza, Seasonal, Injecte, Preservative Fre 08/23/2013  . Influenza,inj,Quad PF,6+ Mos 07/01/2014, 07/19/2017, 10/15/2019, 07/17/2020  . Influenza-Unspecified 07/01/2014  . Moderna SARS-COV2 Booster Vaccination 09/23/2020  . Moderna Sars-Covid-2 Vaccination 12/18/2019, 01/14/2020  . Pneumococcal Polysaccharide-23 04/24/2013  . Tdap 05/13/2016       Objective:   Physical Exam BP 130/78 (BP Location: Left Arm, Patient Position: Sitting, Cuff Size: Normal)   Pulse 79   Temp 97.7 F (36.5 C) (Temporal)   Ht 5\' 4"  (1.626 m)   Wt 119 lb 6.4 oz (54.2 kg)   SpO2 99%   BMI 20.49 kg/m  GENERAL: Well-developed well-nourished, no cushingoid features.  Fully ambulatory, no distress. HEAD: Normocephalic, atraumatic.  EYES: Pupils equal, round, reactive to light.  No scleral icterus.  MOUTH: Nose/mouth/throat not examined due to masking requirements for COVID 19.  She does have tenderness over the maxillary sinuses particularly on the right. NECK: Supple. No thyromegaly. Trachea midline. No JVD.  No adenopathy. PULMONARY: Good air entry bilaterally.  No adventitious sounds. CARDIOVASCULAR: S1 and S2. Regular rate and rhythm.  No rubs, murmurs or gallops heard. ABDOMEN: Benign. MUSCULOSKELETAL: No joint deformity, no clubbing, no edema.  NEUROLOGIC: No focal deficits.  Fully ambulatory.  Speech is fluent. SKIN: Intact,warm,dry.  Limited exam shows no rashes. PSYCH: Mood and behavior normal.     Assessment & Plan:     ICD-10-CM   1. Acute recurrent maxillary sinusitis  J01.01 CT MAXILLOFACIAL W CONTRAST    CANCELED: CT MAXILLOFACIAL W CONTRAST   Has failed 2 rounds of antibiotic Trial of Ceftin 500 twice daily x10 days CT sinuses  2. Deviated nasal septum  J34.2 CT MAXILLOFACIAL W CONTRAST    CANCELED: CT MAXILLOFACIAL W & WO CONTRAST   CT sinuses to further assess  3. Severe persistent asthma without complication  J45.50    Continue  Symbicort and Spiriva Continue Xolair Continue Accolate     Orders Placed This Encounter  Procedures  . CT MAXILLOFACIAL W CONTRAST    Standing Status:   Future    Standing Expiration Date:   02/16/2022    Order Specific Question:   If indicated for the ordered procedure, I authorize the administration of contrast media per Radiology protocol    Answer:   Yes    Order Specific Question:   Is patient pregnant?    Answer:   No    Order Specific Question:   Preferred imaging location?    Answer:   [facility]   Meds ordered this encounter  Medications  . cefUROXime (CEFTIN) 250 MG tablet    Sig: Take 1 tablet (250 mg total) by mouth 2 (two) times daily with a meal for 10 days.    Dispense:  20 tablet    Refill:  0   Patient has severe persistent asthma requiring multiple agents to control.  She is to continue the same  regimen for now.  She has not been able to wean off of some of her medications due to persistent problems with recurrent sinusitis over the last 2 months.  She has failed 2 rounds of antibiotics.  She has issues with deviated nasal septum.  She has an upcoming appointment with ENT.  Will proceed with obtaining CT scan of the sinuses to further evaluate this issue as she may need more definitive intervention.  In the meantime will treat with Ceftin 500 mg twice daily until results of sinus CT are known.  Gailen Shelter, MD [facility]   *This note was dictated using voice recognition software/Dragon.  Despite best efforts to proofread, errors can occur which can change the meaning.  Any change was purely unintentional.

## 2021-02-17 ENCOUNTER — Telehealth: Payer: Self-pay | Admitting: Pulmonary Disease

## 2021-02-17 NOTE — Telephone Encounter (Signed)
Spoke to patient, who is requesting update on CT piror auth.  Patient is aware that a message will be sent to [facility] for update.   Susan Payne, please advise. Thanks

## 2021-02-18 ENCOUNTER — Ambulatory Visit: Payer: Managed Care, Other (non HMO)

## 2021-02-18 NOTE — Telephone Encounter (Signed)
I called [employer] and was transferred to [facility] to check the status of the CT request. The case is still in review.

## 2021-02-19 NOTE — Telephone Encounter (Signed)
We finally got the CT approval today. I have called Susan Payne and she is aware. She stated that she did get in to see her ENT and he suggested to wait and finish the antibiotics and then do the CT to see if any residual is left.  When she gets back from her vacation she will call me back to get the CT scheduled  Auth # [id number] valid 02/16/2021 to 05/17/2021

## 2021-03-18 ENCOUNTER — Other Ambulatory Visit: Payer: Self-pay

## 2021-03-18 MED ORDER — BUDESONIDE-FORMOTEROL FUMARATE 160-4.5 MCG/ACT IN AERO: INHALATION_SPRAY | RESPIRATORY_TRACT | 3 refills | Status: DC

## 2021-03-18 NOTE — Telephone Encounter (Signed)
90 day supply of Symbicort 160 has been sent to [facility], as requested by pharmacy.

## 2021-04-14 ENCOUNTER — Encounter: Payer: Self-pay | Admitting: Otolaryngology

## 2021-04-15 NOTE — Discharge Instructions (Signed)
[facility] ENDOSCOPIC SINUS SURGERY [facility]  What is Functional Endoscopic Sinus Surgery?  The Surgery involves making the natural openings of the sinuses larger by removing the bony partitions that separate the sinuses from the nasal cavity.  The natural sinus lining is preserved as much as possible to allow the sinuses to resume normal function after the surgery.  In some patients nasal polyps (excessively swollen lining of the sinuses) may be removed to relieve obstruction of the sinus openings.  The surgery is performed through the nose using lighted scopes, which eliminates the need for incisions on the face.  A septoplasty is a different procedure which is sometimes performed with sinus surgery.  It involves straightening the boy partition that separates the two sides of your nose.  A crooked or deviated septum may need repair if is obstructing the sinuses or nasal airflow.  Turbinate reduction is also often performed during sinus surgery.  The turbinates are bony proturberances from the side walls of the nose which swell and can obstruct the nose in patients with sinus and allergy problems.  Their size can be surgically reduced to help relieve nasal obstruction.  What Can Sinus Surgery Do For Me?  Sinus surgery can reduce the frequency of sinus infections requiring antibiotic treatment.  This can provide improvement in nasal congestion, post-nasal drainage, facial pressure and nasal obstruction.  Surgery will NOT prevent you from ever having an infection again, so it usually only for patients who get infections 4 or more times yearly requiring antibiotics, or for infections that do not clear with antibiotics.  It will not cure nasal allergies, so patients with allergies may still require medication to treat their allergies after surgery. Surgery may improve headaches related to sinusitis, however, some people will continue to  require medication to control sinus headaches related to allergies.  Surgery will do nothing for other forms of headache (migraine, tension or cluster).  What Are the Risks of Endoscopic Sinus Surgery?  Current techniques allow surgery to be performed safely with little risk, however, there are rare complications that patients should be aware of.  Because the sinuses are located around the eyes, there is risk of eye injury, including blindness, though again, this would be quite rare. This is usually a result of bleeding behind the eye during surgery, which puts the vision oat risk, though there are treatments to protect the vision and prevent permanent disrupted by surgery causing a leak of the spinal fluid that surrounds the brain.  More serious complications would include bleeding inside the brain cavity or damage to the brain.  Again, all of these complications are uncommon, and spinal fluid leaks can be safely managed surgically if they occur.  The most common complication of sinus surgery is bleeding from the nose, which may require packing or cauterization of the nose.  Continued sinus have polyps may experience recurrence of the polyps requiring revision surgery.  Alterations of sense of smell or injury to the tear ducts are also rare complications.   What is the Surgery Like, and what is the Recovery?  The Surgery usually takes a couple of hours to perform, and is usually performed under a general anesthetic (completely asleep).  Patients are usually discharged home after a couple of hours.  Sometimes during surgery it is necessary to pack the nose to control bleeding, and the packing is left in place for 24 - 48 hours, and removed by your surgeon.    If a septoplasty was performed during the procedure, there is often a splint placed which must be removed after 5-7 days.   Discomfort: Pain is usually mild to moderate, and can be controlled by prescription pain medication or acetaminophen (Tylenol).   Aspirin, Ibuprofen (Advil, Motrin), or Naprosyn (Aleve) should be avoided, as they can cause increased bleeding.  Most patients feel sinus pressure like they have a bad head cold for several days.  Sleeping with your head elevated can help reduce swelling and facial pressure, as can ice packs over the face.  A humidifier may be helpful to keep the mucous and blood from drying in the nose.   Diet: There are no specific diet restrictions, however, you should generally start with clear liquids and a light diet of bland foods because the anesthetic can cause some nausea.  Advance your diet depending on how your stomach feels.  Taking your pain medication with food will often help reduce stomach upset which pain medications can cause.  Nasal Saline Irrigation: It is important to remove blood clots and dried mucous from the nose as it is healing.  This is done by having you irrigate the nose at least 3 - 4 times daily with a salt water solution.  We recommend using NeilMed Sinus Rinse (available at the drug store).  Fill the squeeze bottle with the solution, bend over a sink, and insert the tip of the squeeze bottle into the nose  of an inch.  Point the tip of the squeeze bottle towards the inside corner of the eye on the same side your irrigating.  Squeeze the bottle and gently irrigate the nose.  If you bend forward as you do this, most of the fluid will flow back out of the nose, instead of down your throat.   The solution should be warm, near body temperature, when you irrigate.   Each time you irrigate, you should use a full squeeze bottle.   Note that if you are instructed to use Nasal Steroid Sprays at any time after your surgery, irrigate with saline BEFORE using the steroid spray, so you do not wash it all out of the nose. Another product, Nasal Saline Gel (such as AYR Nasal Saline Gel) can be applied in each nostril 3 - 4 times daily to moisture the nose and reduce scabbing or crusting.  Bleeding:   Bloody drainage from the nose can be expected for several days, and patients are instructed to irrigate their nose frequently with salt water to help remove mucous and blood clots.  The drainage may be dark red or brown, though some fresh blood may be seen intermittently, especially after irrigation.  Do not blow you nose, as bleeding may occur. If you must sneeze, keep your mouth open to allow air to escape through your mouth.  If heavy bleeding occurs: Irrigate the nose with saline to rinse out clots, then spray the nose 3 - 4 times with Afrin Nasal Decongestant Spray.  The spray will constrict the blood vessels to slow bleeding.  Pinch the lower half of your nose shut to apply pressure, and lay down with your head elevated.  Ice packs over the nose may help as well. If bleeding persists despite these measures, you should notify your doctor.  Do not use the Afrin routinely to control nasal congestion after surgery, as it can result in worsening congestion and may affect healing.     Activity: Return to work varies among patients. Most patients will be   out of work at least 5 - 7 days to recover.  Patient may return to work after they are off of narcotic pain medication, and feeling well enough to perform the functions of their job.  Patients must avoid heavy lifting (over 10 pounds) or strenuous physical for 2 weeks after surgery, so your employer may need to assign you to light duty, or keep you out of work longer if light duty is not possible.  NOTE: you should not drive, operate dangerous machinery, do any mentally demanding tasks or make any important legal or financial decisions while on narcotic pain medication and recovering from the general anesthetic.    Call Your Doctor Immediately if You Have Any of the Following: Bleeding that you cannot control with the above measures Loss of vision, double vision, bulging of the eye or black eyes. Fever over 101 degrees Neck stiffness with severe headache,  fever, nausea and change in mental state. You are always encourage to call anytime with concerns, however, please call with requests for pain medication refills during office hours.  Office Endoscopy: During follow-up visits your doctor will remove any packing or splints that may have been placed and evaluate and clean your sinuses endoscopically.  Topical anesthetic will be used to make this as comfortable as possible, though you may want to take your pain medication prior to the visit.  How often this will need to be done varies from patient to patient.  After complete recovery from the surgery, you may need follow-up endoscopy from time to time, particularly if there is concern of recurrent infection or nasal polyps.  [facility] ENDOSCOPIC SINUS SURGERY [facility]  What is Functional Endoscopic Sinus Surgery?  The Surgery involves making the natural openings of the sinuses larger by removing the bony partitions that separate the sinuses from the nasal cavity.  The natural sinus lining is preserved as much as possible to allow the sinuses to resume normal function after the surgery.  In some patients nasal polyps (excessively swollen lining of the sinuses) may be removed to relieve obstruction of the sinus openings.  The surgery is performed through the nose using lighted scopes, which eliminates the need for incisions on the face.  A septoplasty is a different procedure which is sometimes performed with sinus surgery.  It involves straightening the boy partition that separates the two sides of your nose.  A crooked or deviated septum may need repair if is obstructing the sinuses or nasal airflow.  Turbinate reduction is also often performed during sinus surgery.  The turbinates are bony proturberances from the side walls of the nose which swell and can obstruct the nose in patients with sinus and allergy problems.  Their size can be  surgically reduced to help relieve nasal obstruction.  What Can Sinus Surgery Do For Me?  Sinus surgery can reduce the frequency of sinus infections requiring antibiotic treatment.  This can provide improvement in nasal congestion, post-nasal drainage, facial pressure and nasal obstruction.  Surgery will NOT prevent you from ever having an infection again, so it usually only for patients who get infections 4 or more times yearly requiring antibiotics, or for infections that do not clear with antibiotics.  It will not cure nasal allergies, so patients with allergies may still require medication to treat their allergies after surgery. Surgery may improve headaches related to sinusitis, however, some people will continue to require medication to control sinus headaches related to  allergies.  Surgery will do nothing for other forms of headache (migraine, tension or cluster).  What Are the Risks of Endoscopic Sinus Surgery?  Current techniques allow surgery to be performed safely with little risk, however, there are rare complications that patients should be aware of.  Because the sinuses are located around the eyes, there is risk of eye injury, including blindness, though again, this would be quite rare. This is usually a result of bleeding behind the eye during surgery, which puts the vision oat risk, though there are treatments to protect the vision and prevent permanent disrupted by surgery causing a leak of the spinal fluid that surrounds the brain.  More serious complications would include bleeding inside the brain cavity or damage to the brain.  Again, all of these complications are uncommon, and spinal fluid leaks can be safely managed surgically if they occur.  The most common complication of sinus surgery is bleeding from the nose, which may require packing or cauterization of the nose.  Continued sinus have polyps may experience recurrence of the polyps requiring revision surgery.  Alterations of sense  of smell or injury to the tear ducts are also rare complications.   What is the Surgery Like, and what is the Recovery?  The Surgery usually takes a couple of hours to perform, and is usually performed under a general anesthetic (completely asleep).  Patients are usually discharged home after a couple of hours.  Sometimes during surgery it is necessary to pack the nose to control bleeding, and the packing is left in place for 24 - 48 hours, and removed by your surgeon.  If a septoplasty was performed during the procedure, there is often a splint placed which must be removed after 5-7 days.   Discomfort: Pain is usually mild to moderate, and can be controlled by prescription pain medication or acetaminophen (Tylenol).  Aspirin, Ibuprofen (Advil, Motrin), or Naprosyn (Aleve) should be avoided, as they can cause increased bleeding.  Most patients feel sinus pressure like they have a bad head cold for several days.  Sleeping with your head elevated can help reduce swelling and facial pressure, as can ice packs over the face.  A humidifier may be helpful to keep the mucous and blood from drying in the nose.   Diet: There are no specific diet restrictions, however, you should generally start with clear liquids and a light diet of bland foods because the anesthetic can cause some nausea.  Advance your diet depending on how your stomach feels.  Taking your pain medication with food will often help reduce stomach upset which pain medications can cause.  Nasal Saline Irrigation: It is important to remove blood clots and dried mucous from the nose as it is healing.  This is done by having you irrigate the nose at least 3 - 4 times daily with a salt water solution.  We recommend using NeilMed Sinus Rinse (available at the drug store).  Fill the squeeze bottle with the solution, bend over a sink, and insert the tip of the squeeze bottle into the nose  of an inch.  Point the tip of the squeeze bottle towards the inside  corner of the eye on the same side your irrigating.  Squeeze the bottle and gently irrigate the nose.  If you bend forward as you do this, most of the fluid will flow back out of the nose, instead of down your throat.   The solution should be warm, near body temperature, when you irrigate.  Each time you irrigate, you should use a full squeeze bottle.   Note that if you are instructed to use Nasal Steroid Sprays at any time after your surgery, irrigate with saline BEFORE using the steroid spray, so you do not wash it all out of the nose. Another product, Nasal Saline Gel (such as AYR Nasal Saline Gel) can be applied in each nostril 3 - 4 times daily to moisture the nose and reduce scabbing or crusting.  Bleeding:  Bloody drainage from the nose can be expected for several days, and patients are instructed to irrigate their nose frequently with salt water to help remove mucous and blood clots.  The drainage may be dark red or brown, though some fresh blood may be seen intermittently, especially after irrigation.  Do not blow you nose, as bleeding may occur. If you must sneeze, keep your mouth open to allow air to escape through your mouth.  If heavy bleeding occurs: Irrigate the nose with saline to rinse out clots, then spray the nose 3 - 4 times with Afrin Nasal Decongestant Spray.  The spray will constrict the blood vessels to slow bleeding.  Pinch the lower half of your nose shut to apply pressure, and lay down with your head elevated.  Ice packs over the nose may help as well. If bleeding persists despite these measures, you should notify your doctor.  Do not use the Afrin routinely to control nasal congestion after surgery, as it can result in worsening congestion and may affect healing.     Activity: Return to work varies among patients. Most patients will be out of work at least 5 - 7 days to recover.  Patient may return to work after they are off of narcotic pain medication, and feeling well enough  to perform the functions of their job.  Patients must avoid heavy lifting (over 10 pounds) or strenuous physical for 2 weeks after surgery, so your employer may need to assign you to light duty, or keep you out of work longer if light duty is not possible.  NOTE: you should not drive, operate dangerous machinery, do any mentally demanding tasks or make any important legal or financial decisions while on narcotic pain medication and recovering from the general anesthetic.    Call Your Doctor Immediately if You Have Any of the Following: Bleeding that you cannot control with the above measures Loss of vision, double vision, bulging of the eye or black eyes. Fever over 101 degrees Neck stiffness with severe headache, fever, nausea and change in mental state. You are always encourage to call anytime with concerns, however, please call with requests for pain medication refills during office hours.  Office Endoscopy: During follow-up visits your doctor will remove any packing or splints that may have been placed and evaluate and clean your sinuses endoscopically.  Topical anesthetic will be used to make this as comfortable as possible, though you may want to take your pain medication prior to the visit.  How often this will need to be done varies from patient to patient.  After complete recovery from the surgery, you may need follow-up endoscopy from time to time, particularly if there is concern of recurrent infection or nasal polyps.

## 2021-04-19 DIAGNOSIS — U071 COVID-19: Secondary | ICD-10-CM

## 2021-04-19 HISTORY — DX: COVID-19: U07.1

## 2021-05-10 ENCOUNTER — Other Ambulatory Visit: Payer: Self-pay | Admitting: Pulmonary Disease

## 2021-06-14 ENCOUNTER — Encounter: Payer: Self-pay | Admitting: Otolaryngology

## 2021-06-22 ENCOUNTER — Encounter: Payer: Self-pay | Admitting: Otolaryngology

## 2021-06-22 ENCOUNTER — Ambulatory Visit
Admission: RE | Admit: 2021-06-22 | Discharge: 2021-06-22 | Disposition: A | Discharge status: Home / Self Care | Payer: Managed Care, Other (non HMO) | Attending: Otolaryngology | Admitting: Otolaryngology

## 2021-06-22 ENCOUNTER — Other Ambulatory Visit: Payer: Self-pay

## 2021-06-22 ENCOUNTER — Ambulatory Visit: Payer: Managed Care, Other (non HMO) | Admitting: Anesthesiology

## 2021-06-22 ENCOUNTER — Encounter
Admission: RE | Disposition: A | Discharge status: Home / Self Care | Payer: Self-pay | Source: Home / Self Care | Attending: Otolaryngology

## 2021-06-22 DIAGNOSIS — Z79899 Other long term (current) drug therapy: Secondary | ICD-10-CM | POA: Diagnosis not present

## 2021-06-22 DIAGNOSIS — J342 Deviated nasal septum: Secondary | ICD-10-CM | POA: Insufficient documentation

## 2021-06-22 DIAGNOSIS — J301 Allergic rhinitis due to pollen: Secondary | ICD-10-CM | POA: Insufficient documentation

## 2021-06-22 DIAGNOSIS — J343 Hypertrophy of nasal turbinates: Secondary | ICD-10-CM | POA: Diagnosis not present

## 2021-06-22 DIAGNOSIS — J3489 Other specified disorders of nose and nasal sinuses: Secondary | ICD-10-CM | POA: Diagnosis not present

## 2021-06-22 DIAGNOSIS — Z87891 Personal history of nicotine dependence: Secondary | ICD-10-CM | POA: Insufficient documentation

## 2021-06-22 HISTORY — DX: Gastro-esophageal reflux disease without esophagitis: K21.9

## 2021-06-22 HISTORY — DX: Presence of spectacles and contact lenses: Z97.3

## 2021-06-22 HISTORY — PX: ENDOSCOPIC CONCHA BULLOSA RESECTION: SHX6395

## 2021-06-22 HISTORY — DX: Motion sickness, initial encounter: T75.3XXA

## 2021-06-22 HISTORY — PX: NASAL SEPTOPLASTY W/ TURBINOPLASTY: SHX2070

## 2021-06-22 LAB — POCT PREGNANCY, URINE: Preg Test, Ur: NEGATIVE

## 2021-06-22 SURGERY — SEPTOPLASTY, NOSE, WITH NASAL TURBINATE REDUCTION
Anesthesia: General | Site: Nose | Laterality: Bilateral

## 2021-06-22 MED ORDER — LACTATED RINGERS IV SOLN: INTRAVENOUS | Status: DC

## 2021-06-22 MED ORDER — FENTANYL CITRATE (PF) 100 MCG/2ML IJ SOLN
INTRAMUSCULAR | Status: DC | PRN
  Administered 2021-06-22 (×2): 50 ug via INTRAVENOUS

## 2021-06-22 MED ORDER — OXYCODONE HCL 5 MG/5ML PO SOLN
5.0000 mg | Freq: Once | ORAL | Status: AC | PRN
  Administered 2021-06-22: 5 mg via ORAL

## 2021-06-22 MED ORDER — OXYMETAZOLINE HCL 0.05 % NA SOLN
NASAL | Status: DC | PRN
  Administered 2021-06-22: 1 via TOPICAL

## 2021-06-22 MED ORDER — GLYCOPYRROLATE 0.2 MG/ML IJ SOLN
INTRAMUSCULAR | Status: DC | PRN
  Administered 2021-06-22: .1 mg via INTRAVENOUS

## 2021-06-22 MED ORDER — FENTANYL CITRATE PF 50 MCG/ML IJ SOSY: 25.0000 ug | PREFILLED_SYRINGE | INTRAMUSCULAR | Status: DC | PRN

## 2021-06-22 MED ORDER — ACETAMINOPHEN 10 MG/ML IV SOLN
1000.0000 mg | Freq: Once | INTRAVENOUS | Status: AC
  Administered 2021-06-22: 1000 mg via INTRAVENOUS

## 2021-06-22 MED ORDER — PROPOFOL 10 MG/ML IV BOLUS
INTRAVENOUS | Status: DC | PRN
  Administered 2021-06-22: 140 mg via INTRAVENOUS

## 2021-06-22 MED ORDER — LIDOCAINE-EPINEPHRINE 1 %-1:100000 IJ SOLN
INTRAMUSCULAR | Status: DC | PRN
  Administered 2021-06-22: 16 mL

## 2021-06-22 MED ORDER — DOXYCYCLINE HYCLATE 100 MG PO CAPS: ORAL_CAPSULE | ORAL | 0 refills | Status: DC

## 2021-06-22 MED ORDER — OXYCODONE HCL 5 MG PO TABS: 5.0000 mg | ORAL_TABLET | Freq: Once | ORAL | Status: AC | PRN

## 2021-06-22 MED ORDER — EPHEDRINE SULFATE 50 MG/ML IJ SOLN
INTRAMUSCULAR | Status: DC | PRN
  Administered 2021-06-22: 10 mg via INTRAVENOUS
  Administered 2021-06-22 (×2): 5 mg via INTRAVENOUS

## 2021-06-22 MED ORDER — SUCCINYLCHOLINE CHLORIDE 200 MG/10ML IV SOSY
PREFILLED_SYRINGE | INTRAVENOUS | Status: DC | PRN
  Administered 2021-06-22: 80 mg via INTRAVENOUS

## 2021-06-22 MED ORDER — DEXAMETHASONE SODIUM PHOSPHATE 4 MG/ML IJ SOLN
INTRAMUSCULAR | Status: DC | PRN
Start: 2021-06-22 — End: 2021-06-22
  Administered 2021-06-22: 10 mg via INTRAVENOUS

## 2021-06-22 MED ORDER — LIDOCAINE HCL (CARDIAC) PF 100 MG/5ML IV SOSY
PREFILLED_SYRINGE | INTRAVENOUS | Status: DC | PRN
  Administered 2021-06-22: 50 mg via INTRAVENOUS

## 2021-06-22 MED ORDER — SCOPOLAMINE 1 MG/3DAYS TD PT72
1.0000 | MEDICATED_PATCH | TRANSDERMAL | Status: DC
  Administered 2021-06-22: 1.5 mg via TRANSDERMAL

## 2021-06-22 MED ORDER — ONDANSETRON HCL 4 MG/2ML IJ SOLN
INTRAMUSCULAR | Status: DC | PRN
Start: 2021-06-22 — End: 2021-06-22
  Administered 2021-06-22: 4 mg via INTRAVENOUS

## 2021-06-22 MED ORDER — MIDAZOLAM HCL 5 MG/5ML IJ SOLN
INTRAMUSCULAR | Status: DC | PRN
  Administered 2021-06-22: 2 mg via INTRAVENOUS

## 2021-06-22 MED ORDER — HYDROCODONE-ACETAMINOPHEN 5-325 MG PO TABS: 1.0000 | ORAL_TABLET | Freq: Four times a day (QID) | ORAL | 0 refills | Status: DC | PRN

## 2021-06-22 SURGICAL SUPPLY — 21 items
CANISTER SUCT 1200ML W/VALVE (MISCELLANEOUS) ×2 IMPLANT
COAG SUCT 10F 3.5MM HAND CTRL (MISCELLANEOUS) ×2 IMPLANT
ELECT REM PT RETURN 9FT ADLT (ELECTROSURGICAL) ×2
ELECTRODE REM PT RTRN 9FT ADLT (ELECTROSURGICAL) ×1 IMPLANT
GLOVE SURG ENC MOIS LTX SZ7.5 (GLOVE) ×4 IMPLANT
GOWN STRL REUS W/ TWL LRG LVL3 (GOWN DISPOSABLE) ×1 IMPLANT
GOWN STRL REUS W/TWL LRG LVL3 (GOWN DISPOSABLE) ×2
KIT TURNOVER KIT A (KITS) ×2 IMPLANT
NEEDLE HYPO 25GX1X1/2 BEV (NEEDLE) ×2 IMPLANT
NS IRRIG 500ML POUR BTL (IV SOLUTION) ×2 IMPLANT
PACK ENT CUSTOM (PACKS) ×2 IMPLANT
PATTIES SURGICAL .5 X3 (DISPOSABLE) ×2 IMPLANT
SPLINT NASAL SEPTAL PRE-CUT (MISCELLANEOUS) ×2 IMPLANT
SUT CHROMIC 4 0 RB 1X27 (SUTURE) ×2 IMPLANT
SUT ETHILON 4-0 (SUTURE) ×2
SUT ETHILON 4-0 FS2 18XMFL BLK (SUTURE) ×1
SUT PLAIN GUT 4-0 (SUTURE) ×2 IMPLANT
SUTURE ETHLN 4-0 FS2 18XMF BLK (SUTURE) ×1 IMPLANT
SYR 10ML LL (SYRINGE) ×2 IMPLANT
TOWEL OR 17X26 4PK STRL BLUE (TOWEL DISPOSABLE) ×2 IMPLANT
WATER STERILE IRR 250ML POUR (IV SOLUTION) ×2 IMPLANT

## 2021-06-22 NOTE — Anesthesia Preprocedure Evaluation (Signed)
Anesthesia Evaluation  Patient identified by MRN, date of birth, ID band Patient awake    Reviewed: Allergy & Precautions, H&P , NPO status , Patient's Chart, lab work & pertinent test results  History of Anesthesia Complications Negative for: history of anesthetic complications  Airway Mallampati: II  TM Distance: <3 FB Neck ROM: full    Dental no notable dental hx.    Pulmonary asthma , former smoker,    Pulmonary exam normal        Cardiovascular Normal cardiovascular exam Rhythm:regular Rate:Normal     Neuro/Psych Anxiety negative neurological ROS     GI/Hepatic Neg liver ROS, Medicated,  Endo/Other  negative endocrine ROS  Renal/GU negative Renal ROS  negative genitourinary   Musculoskeletal   Abdominal   Peds  Hematology negative hematology ROS (+)   Anesthesia Other Findings   Reproductive/Obstetrics                             Anesthesia Physical Anesthesia Plan  ASA: 2  Anesthesia Plan: General ETT   Post-op Pain Management:    Induction:   PONV Risk Score and Plan: 3 and Ondansetron, Dexamethasone, Midazolam and Treatment may vary due to age or medical condition  Airway Management Planned:   Additional Equipment:   Intra-op Plan:   Post-operative Plan:   Informed Consent: I have reviewed the patients History and Physical, chart, labs and discussed the procedure including the risks, benefits and alternatives for the proposed anesthesia with the patient or authorized representative who has indicated his/her understanding and acceptance.       Plan Discussed with:   Anesthesia Plan Comments:         Anesthesia Quick Evaluation

## 2021-06-22 NOTE — Op Note (Signed)
06/22/2021  12:37 PM    Susan Payne  [id number]   Pre-Op Diagnosis:  Nasal obstruction with septal deviation, Bilateral middle turbinate concha bullosae, and inferior turbinate hypertrophy  Post-op Diagnosis: Same  Procedure: 1) Nasal Septoplasty, 2) Bilateral middle turbinate concha bullosa resection, 3) Bilateral submucous resection of the inferior turbinates  Surgeon:  Sandi Mealy  Anesthesia:  General endotracheal  EBL:  less than 25 cc  Complications:  None  Findings: right septal deviation. Hypertrophy of the inferior turbinates. Bilateral concha bullosae of the middle turbinates  Procedure: The patient was taken to the Operating Room and placed in the supine position.  After induction of general endotracheal anesthesia, the table was turned 90 degrees. A time-out was issued to confirm the site and procedure. The nasal septum, middle and inferior turbinates where then injected with 1% lidocaine with epiniephrine, 1:100,000. The nose was decongested with Afrin soaked pledgets. The patient was then prepped and draped in the usual sterile fashion.   The left nasal cavity was inspected endoscopically. The middle turbinate was incised with a sickle knife, and using endoscopic scissors, the lateral aspect of the concha bullosa was resected and removed using an ethmoid forcep. The same procedure was then performed on the right middle turbinate. Bleeding was minimal.   Beginning on the left hand side a hemitransfixion incision was then created on the leading edge of the septum on the left.  A subperichondrial plane was elevated posteriorly on the left and taken back to the perpendicular plate of the ethmoid where subperiosteal plane was elevated posteriorly on the left. A large septal spur was identified on the right hand side.  The perpendicular plate of the ethmoid was separated from the quadrangular cartilage, and a subperiosteal plane elevated on the right of the bony septum. The  large septal spur was removed, dividing the septal bone superiorly with Knight scissors, and inferiorly from the maxillary crest with a chisel.    The septum was then replaced in the midline. Reinspection through each nostril showed excellent reduction of the septal deformity. A left posterior inferior fenestration was then created to allow hematoma drainage.  The septal incision was closed with 4-0 chromic gut suture. A 4-0 plain gut suture was used to reapproximate the septal flaps to the underlying cartilage, utilizing a running, quilting type stitch.   With the septoplasty completed, beginning on the left-hand side, a 15 blade was used to incise along the inferior edge of the inferior turbinate. A superior laterally based flap of the medial turbinate mucosa was then elevated. A portion of the underlying conchal bone and lateral mucosa was excised using Knight scissors. The flap was then laid back over the turbinate stump and the bleeding edge of the turbinate cauterized using suction cautery. In a similar fashion the submucous resection was performed on the right.  With the submucous resection completed bilaterally and no active bleeding, nasal septal splints were placed within each nostril and affixed to the septum using a 3-0 nylon suture.     The patient was then returned to the anesthesiologist for awakening, and was taken to the Recovery Room in stable condition.  Disposition:   PACU to home  Plan: Irrigate each side of the nose with saline 3-4 times daily. Take pain medications and antibiotics as prescribed. No strenuous activity for 2 weeks. Follow-up in 1 week for splint removal.   Sandi Mealy 06/22/2021 12:37 PM

## 2021-06-22 NOTE — Transfer of Care (Signed)
Immediate Anesthesia Transfer of Care Note  Patient: Roselee Tayloe  Procedure(s) Performed: NASAL SEPTOPLASTY [place] BILATERAL SUBMUCOUS RESECTION (Bilateral: Nose) ENDOSCOPIC CONCHA BULLOSA RESECTION (Bilateral: Nose)  Patient Location: PACU  Anesthesia Type: General ETT  Level of Consciousness: awake, alert  and patient cooperative  Airway and Oxygen Therapy: Patient Spontanous Breathing and Patient connected to supplemental oxygen  Post-op Assessment: Post-op Vital signs reviewed, Patient's Cardiovascular Status Stable, Respiratory Function Stable, Patent Airway and No signs of Nausea or vomiting  Post-op Vital Signs: Reviewed and stable  Complications: No notable events documented.

## 2021-06-22 NOTE — Anesthesia Procedure Notes (Addendum)
Procedure Name: Intubation Date/Time: 06/22/2021 11:33 AM Performed by: Jimmy Picket, CRNA Pre-anesthesia Checklist: Patient identified, Emergency Drugs available, Suction available, Patient being monitored and Timeout performed Patient Re-evaluated:Patient Re-evaluated prior to induction Oxygen Delivery Method: Circle system utilized Preoxygenation: Pre-oxygenation with 100% oxygen Induction Type: IV induction Ventilation: Mask ventilation without difficulty Laryngoscope Size: Miller and 2 Grade View: Grade I Tube type: Oral Rae Tube size: 7.0 mm Number of attempts: 1 Placement Confirmation: ETT inserted through vocal cords under direct vision, positive ETCO2 and breath sounds checked- equal and bilateral Tube secured with: Tape Dental Injury: Teeth and Oropharynx as per pre-operative assessment

## 2021-06-22 NOTE — H&P (Signed)
History and physical reviewed and will be scanned in later. No change in medical status reported by the patient or family, appears stable for surgery. All questions regarding the procedure answered, and patient (or family if a child) expressed understanding of the procedure. ? ?Susan Payne ?@TODAY@ ?

## 2021-06-22 NOTE — Anesthesia Postprocedure Evaluation (Signed)
Anesthesia Post Note  Patient: Susan Payne  Procedure(s) Performed: NASAL SEPTOPLASTY WITh BILATERAL SUBMUCOUS RESECTION (Bilateral: Nose) ENDOSCOPIC CONCHA BULLOSA RESECTION (Bilateral: Nose)     Patient location during evaluation: PACU Anesthesia Type: General Level of consciousness: awake and alert Pain management: pain level controlled Vital Signs Assessment: post-procedure vital signs reviewed and stable Respiratory status: spontaneous breathing Cardiovascular status: stable Anesthetic complications: no   No notable events documented.  Marvis Repress

## 2021-06-23 ENCOUNTER — Encounter: Payer: Self-pay | Admitting: Otolaryngology

## 2021-07-20 ENCOUNTER — Other Ambulatory Visit: Payer: Self-pay

## 2021-07-20 ENCOUNTER — Ambulatory Visit: Payer: Managed Care, Other (non HMO) | Admitting: Pulmonary Disease

## 2021-07-20 ENCOUNTER — Encounter: Payer: Self-pay | Admitting: Pulmonary Disease

## 2021-07-20 ENCOUNTER — Ambulatory Visit (INDEPENDENT_AMBULATORY_CARE_PROVIDER_SITE_OTHER): Payer: Managed Care, Other (non HMO) | Admitting: Pulmonary Disease

## 2021-07-20 VITALS — BP 110/78 | HR 101 | Temp 98.5°F | Ht 64.0 in | Wt 110.0 lb

## 2021-07-20 DIAGNOSIS — J4550 Severe persistent asthma, uncomplicated: Secondary | ICD-10-CM | POA: Diagnosis not present

## 2021-07-20 DIAGNOSIS — K2100 Gastro-esophageal reflux disease with esophagitis, without bleeding: Secondary | ICD-10-CM

## 2021-07-20 DIAGNOSIS — J342 Deviated nasal septum: Secondary | ICD-10-CM | POA: Diagnosis not present

## 2021-07-20 MED ORDER — ARNUITY ELLIPTA 100 MCG/ACT IN AEPB: 1.0000 | INHALATION_SPRAY | Freq: Every day | RESPIRATORY_TRACT | 11 refills | Status: DC

## 2021-07-20 NOTE — Patient Instructions (Signed)
We are decreasing Xolair to 150 mg subcutaneously every 2 weeks  We will discontinue Symbicort and put you on Arnuity Ellipta 1 inhalation daily make sure you rinse your mouth well after you use it.  This will be your maintenance.  Continue as needed albuterol.

## 2021-07-20 NOTE — Progress Notes (Signed)
Subjective:    Patient ID: Susan Payne, female    DOB: 05/30/1981, 40 y.o.   MRN: [id number] Chief Complaint  Patient presents with   Follow-up    Asthma-    HPI Susan Payne is a 40 year old remote former smoker with less than 20-pack-year history of smoking who presents for follow-up of severe persistent asthma and elevated IgE.  Currently on Xolair for management, she is administering this herself at home.  She presents today for a follow-up.  This is a scheduled visit.  He was last seen on 16 Feb 2021 by me.  At that time she was having issues with recurrent sinus infections and was pending ENT appointment.  Since that visit she has had ENT evaluation and actually required nasal septoplasty and bilateral concha bullosa resection and submucous resection of the inferior turbinates performed by Dr. Geanie Logan.  Since that surgery the patient feels markedly better.  She has actually stopped using Symbicort regularly and is only using it on an as-needed basis but feels that she is starting to get some increased shortness of breath and wheezing since she has been interacting more with her cat and since discontinuing regular use of Symbicort.  She feels that sometimes Symbicort makes her jittery.  He is also still using Xolair however she is still in dosed at a higher dose, she has lost significant amount of weight since chronic use of steroids has been stopped.  She does note some globus sensation since she stopped taking Dexilant regularly.  She does have issues with GERD.  She is only on famotidine at present.  She does not endorse any other symptomatology.  Has not had any major flareups since last seen in May.  Review of Systems A 10 point review of systems was performed and it is as noted above otherwise negative.  Current Meds  Medication Sig   albuterol (ACCUNEB) 0.63 MG/3ML nebulizer solution Take 1 ampule by nebulization every 6 (six) hours as needed for wheezing.   albuterol (VENTOLIN HFA)  108 (90 Base) MCG/ACT inhaler INHALE 2 PUFFS INTO THE LUNGS EVERY 4 HOURS AS NEEDED FOR WHEEZING OR SHORTNESS OF BREATH   ATROVENT HFA 17 MCG/ACT inhaler INHALE 2 PUFFS INTO THE LUNGS EVERY 6 HOURS AS NEEDED FOR WHEEZING   azelastine (ASTELIN) 0.1 % nasal spray Place 2 sprays into both nostrils 2 (two) times daily. Use in each nostril as directed   budesonide-formoterol (SYMBICORT) 160-4.5 MCG/ACT inhaler INHALE 2 INHALATIONS INTO THE LUNGS 2 (TWO) TIMES DAILY   busPIRone (BUSPAR) 5 MG tablet as needed.   cyclobenzaprine (FLEXERIL) 10 MG tablet Take 1 tablet (10 mg total) by mouth 2 (two) times daily as needed for muscle spasms.   doxycycline (VIBRAMYCIN) 100 MG capsule 100 mg PO BID x 10 days   EPINEPHrine 0.3 mg/0.3 mL IJ SOAJ injection    fluticasone (FLONASE) 50 MCG/ACT nasal spray Place 1 spray into both nostrils daily.   HYDROcodone-acetaminophen (NORCO/VICODIN) 5-325 MG tablet Take 1-2 tablets by mouth every 6 (six) hours as needed for moderate pain.   ipratropium (ATROVENT) 0.02 % nebulizer solution Take 0.5 mg by nebulization as needed for wheezing or shortness of breath.   LORazepam (ATIVAN) 0.5 MG tablet SMARTSIG:0.5-1 Tablet(s) By Mouth 1 to 2 Times Daily   methylphenidate (METADATE CD) 20 MG CR capsule Take 20 mg by mouth 2 (two) times daily.   sertraline (ZOLOFT) 50 MG tablet Take 50 mg by mouth daily.   traZODone (DESYREL) 50 MG tablet Take 25  mg by mouth at bedtime as needed for sleep.   XOLAIR 150 MG/ML prefilled syringe INJECT 300MG  SUBCUTANEOUSLY EVERY 2 WEEKS (GIVEN AT MD  OFFICE, DISCARD UNUSED)       Objective:   Physical Exam BP 110/78 (BP Location: Left Arm, Patient Position: Sitting, Cuff Size: Normal)   Pulse (!) 101   Temp 98.5 F (36.9 C) (Oral)   Ht 5\' 4"  (1.626 m)   Wt 110 lb (49.9 kg)   SpO2 99%   BMI 18.88 kg/m  GENERAL: Well-developed well-nourished, no cushingoid features.  Fully ambulatory, no distress.  No conversational dyspnea. HEAD: Normocephalic,  atraumatic.  EYES: Pupils equal, round, reactive to light.  No scleral icterus.  MOUTH: Nose/mouth/throat not examined due to masking requirements for COVID 19.  NECK: Supple. No thyromegaly. Trachea midline. No JVD.  No adenopathy. PULMONARY: Good air entry bilaterally.  No adventitious sounds. CARDIOVASCULAR: S1 and S2. Regular rate and rhythm.  No rubs, murmurs or gallops heard. ABDOMEN: Benign. MUSCULOSKELETAL: No joint deformity, no clubbing, no edema.  NEUROLOGIC: No focal deficits.  Fully ambulatory.  Speech is fluent. SKIN: Intact,warm,dry.  Limited exam shows no rashes. PSYCH: Mood and behavior normal.          Assessment & Plan:     ICD-10-CM   1. Severe persistent asthma without complication  J45.50    Appears better control particularly after his sinus surgery Does need to stay on controller medication Decrease Xolair  to 150 mg every 2 week Arnuity    2. Gastroesophageal reflux disease with esophagitis without hemorrhage  K21.00    Resume Dexilant Continue famotidine at bedtime    3. Deviated nasal septum  J34.2    Status post septoplasty Continue follow-up with ENT      Meds ordered this encounter  Medications   Fluticasone Furoate (ARNUITY ELLIPTA) 100 MCG/ACT AEPB    Sig: Inhale 1 puff into the lungs daily.    Dispense:  30 each    Refill:  11   Patient has had issues with severe persistent asthma which have now been controlled after initiation of Xolair.  Further she has had significant control after sinus surgery.  We will decrease her Xolair 250 mg every 2 weeks subcu.  She does still require controller medication and we will switch to as needed albuterol.  The patient has been able to wean off Spiriva and Accolate. She underwent EGD on 09 June 2021 at [facility].  She was noted to have esophagitis and gastritis as well as gastric and duodenal polyps.  Pathology from that showed chronic peptic duodenitis with vascular congestion, reactive gastropathy, no H.  pylori and chronic nonspecific inflammation at the esophagus and GE junction.She still has issues with gastroesophageal reflux and she was reminded that this can trigger asthma and recommend that she continue therapy as directed by GI which is to take Dexilant in the morning and famotidine in the evening.    We will see her in follow-up in months time she is to contact 11 June 2021 prior to that time should any new difficulties arise.    [facility], MD Advanced Bronchoscopy P[facility]    *This note was dictated using voice recognition software/Dragon.  Despite best efforts to proofread, errors can occur which can change the meaning.  Any change was purely unintentional.

## 2021-07-21 ENCOUNTER — Encounter: Payer: Self-pay | Admitting: Pulmonary Disease

## 2021-07-30 ENCOUNTER — Ambulatory Visit: Payer: Managed Care, Other (non HMO)

## 2021-08-12 ENCOUNTER — Other Ambulatory Visit: Payer: Self-pay | Admitting: Pulmonary Disease

## 2021-08-13 ENCOUNTER — Telehealth: Payer: Self-pay

## 2021-08-13 NOTE — Telephone Encounter (Signed)
Medication clarification form signed and faxed.

## 2021-08-23 MED ORDER — FLUTICASONE PROPIONATE HFA 110 MCG/ACT IN AERO: 2.0000 | INHALATION_SPRAY | Freq: Two times a day (BID) | RESPIRATORY_TRACT | 2 refills | Status: DC

## 2021-08-23 NOTE — Telephone Encounter (Signed)
Dr. Gonzalez, please advise. Thanks 

## 2021-08-23 NOTE — Telephone Encounter (Signed)
I do not know what her formulary covers but she could either have Qvar 80 mcg, 2 puffs twice a day or Flovent HFA 110 mcg 2 puffs twice a day.

## 2021-08-25 ENCOUNTER — Other Ambulatory Visit (HOSPITAL_COMMUNITY): Payer: Self-pay

## 2021-08-25 ENCOUNTER — Telehealth: Payer: Self-pay

## 2021-08-25 NOTE — Telephone Encounter (Addendum)
Received notification from [facility] regarding a prior authorization for XOLAIR. Authorization has been APPROVED from 08/25/2021 to 08/25/2022.   Patient must continue to fill through [facility]: [phone]   Authorization # [id number]   Approval letter sent to scan center

## 2021-08-25 NOTE — Telephone Encounter (Signed)
Received notification from [facility] that pt's current PA will be expiring soon.  Per recent OV chart notes, pt's dose has decreased from 250mg  down to 150mg . Most recent prescription (08/13/21) sent in with directions indicating pt is utilizing 4 syringes per 28 days despite now only needing two. Will still submit PA for 4 syringes as to not limit pt should she require return to previous dose.  Submitted a Prior Authorization request to [facility] for XOLAIR via CoverMyMeds. Will update once we receive a response.    Key: [id number]

## 2021-09-28 ENCOUNTER — Ambulatory Visit: Payer: Managed Care, Other (non HMO) | Admitting: Adult Health

## 2021-09-30 ENCOUNTER — Encounter: Payer: Self-pay | Admitting: Pulmonary Disease

## 2021-09-30 ENCOUNTER — Ambulatory Visit (INDEPENDENT_AMBULATORY_CARE_PROVIDER_SITE_OTHER): Payer: Managed Care, Other (non HMO) | Admitting: Pulmonary Disease

## 2021-09-30 ENCOUNTER — Other Ambulatory Visit: Payer: Self-pay

## 2021-09-30 VITALS — BP 110/88 | HR 79 | Temp 97.1°F | Ht 64.0 in | Wt 107.8 lb

## 2021-09-30 DIAGNOSIS — K2100 Gastro-esophageal reflux disease with esophagitis, without bleeding: Secondary | ICD-10-CM

## 2021-09-30 DIAGNOSIS — J4550 Severe persistent asthma, uncomplicated: Secondary | ICD-10-CM

## 2021-09-30 NOTE — Progress Notes (Signed)
Subjective:    Patient ID: Susan Payne, female    DOB: Jun 07, 1981, 40 y.o.   MRN: [id number] Chief Complaint  Patient presents with   Follow-up    Severe Asthma    HPI Molly is a 40 year old remote former smoker with less than 20-pack-year history of smoking who presents for follow-up of severe persistent asthma and elevated IgE.  Currently on Xolair for management, she is administering this herself at home. She presents today for a follow-up.  This is a scheduled visit.  He was last seen on 20 July 2021 by me.  At that time she had Xolair decreased 250 mg every 2 weeks.  She had also had sinus surgery and appeared to be better controlled with regards to her asthma.  Since that surgery the patient feels markedly better.  She had actually stopped using Symbicort regularly and was only using it on an as-needed basis but since that visit she has resumed using Symbicort daily.  Gastroesophageal reflux is well-controlled on Dexilant.  Has not had any major flareups since last seen in May.  She has not had any recent nocturnal awakenings.  Overall she feels well and looks well.   Review of Systems A 10 point review of systems was performed and it is as noted above otherwise negative.  Patient Active Problem List   Diagnosis Date Noted   Elevated IgE level 09/16/2019   Vocal cord dysfunction 09/16/2019   Environmental allergies 09/16/2019   Depression 06/25/2019   GERD (gastroesophageal reflux disease) 06/25/2019   Mild persistent asthma 05/10/2016   Allergic rhinitis 04/09/2014   Healthcare maintenance 08/24/2013   Anxiety 04/24/2013   Asthma without status asthmaticus 02/25/2013   Insomnia 02/25/2013   Social History   Tobacco Use   Smoking status: Former    Packs/day: 1.00    Years: 18.00    Pack years: 18.00    Types: Cigarettes    Quit date: 2016    Years since quitting: 6.9   Smokeless tobacco: Never  Substance Use Topics   Alcohol use: Yes    Comment: occasional     Allergies  Allergen Reactions   Cat Hair Extract Shortness Of Breath   Cedar Shortness Of Breath   Dog Epithelium Allergy Skin Test Shortness Of Breath   Elder [Sambucus Nigra] Shortness Of Breath   Molds & Smuts Shortness Of Breath   Sulfa Antibiotics Nausea And Vomiting   Current Meds  Medication Sig   albuterol (ACCUNEB) 0.63 MG/3ML nebulizer solution Take 1 ampule by nebulization every 6 (six) hours as needed for wheezing.   albuterol (VENTOLIN HFA) 108 (90 Base) MCG/ACT inhaler INHALE 2 PUFFS INTO THE LUNGS EVERY 4 HOURS AS NEEDED FOR WHEEZING OR SHORTNESS OF BREATH   ATROVENT HFA 17 MCG/ACT inhaler INHALE 2 PUFFS INTO THE LUNGS EVERY 6 HOURS AS NEEDED FOR WHEEZING   azelastine (ASTELIN) 0.1 % nasal spray Place 2 sprays into both nostrils 2 (two) times daily. Use in each nostril as directed   budesonide-formoterol (SYMBICORT) 160-4.5 MCG/ACT inhaler Inhale 2 puffs into the lungs 2 (two) times daily.   dexlansoprazole (DEXILANT) 60 MG capsule Take by mouth.   EPINEPHrine 0.3 mg/0.3 mL IJ SOAJ injection    fluticasone (FLONASE) 50 MCG/ACT nasal spray Place 1 spray into both nostrils daily.   HYDROcodone-acetaminophen (NORCO/VICODIN) 5-325 MG tablet Take 1-2 tablets by mouth every 6 (six) hours as needed for moderate pain.   ipratropium (ATROVENT) 0.02 % nebulizer solution Take 0.5 mg by nebulization as  needed for wheezing or shortness of breath.   LORazepam (ATIVAN) 0.5 MG tablet SMARTSIG:0.5-1 Tablet(s) By Mouth 1 to 2 Times Daily   methylphenidate (METADATE CD) 20 MG CR capsule Take 20 mg by mouth 2 (two) times daily.   sertraline (ZOLOFT) 50 MG tablet Take 50 mg by mouth daily.   traZODone (DESYREL) 50 MG tablet Take 25 mg by mouth at bedtime as needed for sleep.   XOLAIR 150 MG/ML prefilled syringe INJECT 300MG  SUBCUTANEOUSLY EVERY 2 WEEKS (DISCARD  UNUSED)   [DISCONTINUED] busPIRone (BUSPAR) 5 MG tablet as needed.   [DISCONTINUED] cyclobenzaprine (FLEXERIL) 10 MG tablet Take 1  tablet (10 mg total) by mouth 2 (two) times daily as needed for muscle spasms.   [DISCONTINUED] doxycycline (VIBRAMYCIN) 100 MG capsule 100 mg PO BID x 10 days   [DISCONTINUED] fluticasone (FLOVENT HFA) 110 MCG/ACT inhaler Inhale 2 puffs into the lungs 2 (two) times daily.   [DISCONTINUED] loratadine (CLARITIN) 10 MG tablet Take by mouth.   [DISCONTINUED] Tiotropium Bromide Monohydrate (SPIRIVA RESPIMAT) 2.5 MCG/ACT AERS Inhale 2 puffs into the lungs daily.   Immunization History  Administered Date(s) Administered   Influenza, Seasonal, Injecte, Preservative Fre 08/23/2013   Influenza,inj,Quad PF,6+ Mos 07/01/2014, 07/19/2017, 10/15/2019, 07/17/2020   Influenza-Unspecified 07/01/2014   Moderna SARS-COV2 Booster Vaccination 09/23/2020   Moderna Sars-Covid-2 Vaccination 12/18/2019, 01/14/2020   Pneumococcal Conjugate-13 06/11/2021   Pneumococcal Polysaccharide-23 04/24/2013   Tdap 05/13/2016       Objective:   Physical Exam BP 110/88 (BP Location: Left Arm, Patient Position: Sitting, Cuff Size: Normal)   Pulse 79   Temp (!) 97.1 F (36.2 C) (Oral)   Ht 5\' 4"  (1.626 m)   Wt 107 lb 12.8 oz (48.9 kg)   SpO2 100%   BMI 18.50 kg/m   SpO2: 100 % O2 Device: None (Room air)   GENERAL: Well-developed well-nourished, no cushingoid features.  Fully ambulatory, no distress.  No conversational dyspnea. HEAD: Normocephalic, atraumatic.  EYES: Pupils equal, round, reactive to light.  No scleral icterus.  MOUTH: Nose/mouth/throat not examined due to masking requirements for COVID 19.  NECK: Supple. No thyromegaly. Trachea midline. No JVD.  No adenopathy. PULMONARY: Good air entry bilaterally.  No adventitious sounds. CARDIOVASCULAR: S1 and S2. Regular rate and rhythm.  No rubs, murmurs or gallops heard. ABDOMEN: Benign. MUSCULOSKELETAL: No joint deformity, no clubbing, no edema.  NEUROLOGIC: No focal deficits.  Fully ambulatory.  Speech is fluent. SKIN: Intact,warm,dry.  Limited exam shows  no rashes. PSYCH: Mood and behavior normal.     Assessment & Plan:     ICD-10-CM   1. Severe persistent asthma without complication  J45.50    Continue Xolair at 150 mg every 2 weeks Continue Symbicort Continue as needed albuterol    2. Gastroesophageal reflux disease with esophagitis without hemorrhage  K21.00    Continue Dexilant as per GI     Patient appears to be well compensated.  Continue same care plan.  We will see her in follow-up in 6 months time she is to contact us prior to that time should any new difficulties arise.   Gailen Shelter, MD Advanced Bronchoscopy PCCM [facility]    *This note was dictated using voice recognition software/Dragon.  Despite best efforts to proofread, errors can occur which can change the meaning. Any transcriptional errors that result from this process are unintentional and may not be fully corrected at the time of dictation.

## 2021-09-30 NOTE — Patient Instructions (Signed)
Continue Symbicort and as needed albuterol.  Continue Xolair at the reduced dose of 150 mg every 2 weeks.  We will see you in follow-up in 6 months time.

## 2021-10-05 ENCOUNTER — Telehealth: Payer: Self-pay | Admitting: Pulmonary Disease

## 2021-10-05 NOTE — Telephone Encounter (Signed)
Lm for patient.  Please schedule mychart visit with NP.

## 2021-10-05 NOTE — Telephone Encounter (Signed)
Noted by triage.  

## 2021-10-06 ENCOUNTER — Telehealth: Payer: Self-pay | Admitting: Pulmonary Disease

## 2021-10-06 ENCOUNTER — Other Ambulatory Visit: Payer: Self-pay

## 2021-10-06 ENCOUNTER — Telehealth (INDEPENDENT_AMBULATORY_CARE_PROVIDER_SITE_OTHER): Payer: Managed Care, Other (non HMO) | Admitting: Nurse Practitioner

## 2021-10-06 DIAGNOSIS — J4550 Severe persistent asthma, uncomplicated: Secondary | ICD-10-CM

## 2021-10-06 NOTE — Telephone Encounter (Signed)
Called by Florene Route d/t episode of sharp pain in upper abdomen and now having pleuritic chest. She has a history of chronic persistent asthma for which she is on Xolair. She reports that she was initially hypothermic with temperature down to 96 F (she is now 70 F). She reports upper respiratory symptoms over the last several days and has been seen in Urgent Care and tested positive for RSV. Differential diagnosis includes pleuritic chest pain d/t RSV infection vs secondary bacterial pneumonia vs ? She has an appointment with Dr.Gonzales in the [facility] [facility] office for tomorrow, however, given her Xolair therapy and recent RSV infection, I recommended that she be evaluated in the Emergency Department as I am concerned about a bacterial secondary infection which would require a CXR for diagnosis.

## 2021-10-06 NOTE — Progress Notes (Deleted)
Patient ID: Susan Payne, female     DOB: 1981-05-07, 40 y.o.      MRN: [id number]  No chief complaint on file.   Virtual Visit via Video Note  I connected with Otelia Santee on 10/06/21 at 12:00 PM EST by a video enabled telemedicine application and verified that I am speaking with the correct person using two identifiers.  Location: Patient: Home Provider: Office   I discussed the limitations of evaluation and management by telemedicine and the availability of in person appointments. The patient expressed understanding and agreed to proceed.  History of Present Illness: 40 year old female, former smoker (18 pack years) followed for severe persistent asthma.  She is a patient of Dr. Patsey Berthold and was last seen 09/30/2021 in office.  Past medical history significant for GERD, anxiety, allergic rhinitis, vocal cord dysfunction.   09/30/2021: OV with Dr. Patsey Berthold. No concerns at visit. Continued Symbicort and Xolair. Albuterol PRN. F/u 6 months.   10/04/2021: Urgent care for flu-like symptoms. Positive for RSV.   Allergies  Allergen Reactions   Cat Hair Extract Shortness Of Breath   Cedar Shortness Of Breath   Dog Epithelium Allergy Skin Test Shortness Of Breath   Elder [Sambucus Nigra] Shortness Of Breath   Molds & Smuts Shortness Of Breath   Sulfa Antibiotics Nausea And Vomiting   Immunization History  Administered Date(s) Administered   Influenza, Seasonal, Injecte, Preservative Fre 08/23/2013   Influenza,inj,Quad PF,6+ Mos 07/01/2014, 07/19/2017, 10/15/2019, 07/17/2020   Influenza-Unspecified 07/01/2014   Moderna SARS-COV2 Booster Vaccination 09/23/2020   Moderna Sars-Covid-2 Vaccination 12/18/2019, 01/14/2020   Pneumococcal Conjugate-13 06/11/2021   Pneumococcal Polysaccharide-23 04/24/2013   Tdap 05/13/2016   Past Medical History:  Diagnosis Date   Anxiety    Asthma    COVID-19 04/19/2021   Fever, body aches.  Resolved   GERD (gastroesophageal reflux disease)     Motion sickness    long car rides   Reflux esophagitis    Wears contact lenses     Tobacco History: Social History   Tobacco Use  Smoking Status Former   Packs/day: 1.00   Years: 18.00   Pack years: 18.00   Types: Cigarettes   Quit date: 2016   Years since quitting: 6.9  Smokeless Tobacco Never   Counseling given: Not Answered   Outpatient Medications Prior to Visit  Medication Sig Dispense Refill   albuterol (ACCUNEB) 0.63 MG/3ML nebulizer solution Take 1 ampule by nebulization every 6 (six) hours as needed for wheezing.     albuterol (VENTOLIN HFA) 108 (90 Base) MCG/ACT inhaler INHALE 2 PUFFS INTO THE LUNGS EVERY 4 HOURS AS NEEDED FOR WHEEZING OR SHORTNESS OF BREATH 8.5 g 5   ATROVENT HFA 17 MCG/ACT inhaler INHALE 2 PUFFS INTO THE LUNGS EVERY 6 HOURS AS NEEDED FOR WHEEZING 12.9 g 7   azelastine (ASTELIN) 0.1 % nasal spray Place 2 sprays into both nostrils 2 (two) times daily. Use in each nostril as directed 60 mL 2   budesonide-formoterol (SYMBICORT) 160-4.5 MCG/ACT inhaler Inhale 2 puffs into the lungs 2 (two) times daily.     dexlansoprazole (DEXILANT) 60 MG capsule Take by mouth.     EPINEPHrine 0.3 mg/0.3 mL IJ SOAJ injection      fluticasone (FLONASE) 50 MCG/ACT nasal spray Place 1 spray into both nostrils daily.     HYDROcodone-acetaminophen (NORCO/VICODIN) 5-325 MG tablet Take 1-2 tablets by mouth every 6 (six) hours as needed for moderate pain. 40 tablet 0   ipratropium (ATROVENT) 0.02 % nebulizer solution  Take 0.5 mg by nebulization as needed for wheezing or shortness of breath.     LORazepam (ATIVAN) 0.5 MG tablet SMARTSIG:0.5-1 Tablet(s) By Mouth 1 to 2 Times Daily     methylphenidate (METADATE CD) 20 MG CR capsule Take 20 mg by mouth 2 (two) times daily.     sertraline (ZOLOFT) 50 MG tablet Take 50 mg by mouth daily.     traZODone (DESYREL) 50 MG tablet Take 25 mg by mouth at bedtime as needed for sleep.     XOLAIR 150 MG/ML prefilled syringe INJECT 300MG   SUBCUTANEOUSLY EVERY 2 WEEKS (DISCARD  UNUSED) 1 mL 6   No facility-administered medications prior to visit.     Review of Systems:   Constitutional: No weight loss or gain, night sweats, fevers, chills, fatigue, or lassitude. HEENT: No headaches, difficulty swallowing, tooth/dental problems, or sore throat. No sneezing, itching, ear ache, nasal congestion, or post nasal drip CV:  No chest pain, orthopnea, PND, swelling in lower extremities, anasarca, dizziness, palpitations, syncope Resp: No shortness of breath with exertion or at rest. No excess mucus or change in color of mucus. No productive or non-productive. No hemoptysis. No wheezing.  No chest wall deformity GI:  No heartburn, indigestion, abdominal pain, nausea, vomiting, diarrhea, change in bowel habits, loss of appetite, bloody stools.  GU: No dysuria, change in color of urine, urgency or frequency.  No flank pain, no hematuria  Skin: No rash, lesions, ulcerations MSK:  No joint pain or swelling.  No decreased range of motion.  No back pain. Neuro: No dizziness or lightheadedness.  Psych: No depression or anxiety. Mood stable.   Observations/Objective:   Assessment and Plan: No problem-specific Assessment & Plan notes found for this encounter.    Follow Up Instructions:    I discussed the assessment and treatment plan with the patient. The patient was provided an opportunity to ask questions and all were answered. The patient agreed with the plan and demonstrated an understanding of the instructions.   The patient was advised to call back or seek an in-person evaluation if the symptoms worsen or if the condition fails to improve as anticipated.  I provided *** minutes of non-face-to-face time during this encounter.   , NP

## 2021-10-06 NOTE — Telephone Encounter (Signed)
Tried calling the pt and the line rings busy. She is scheduled for video visit with Katie today at noon.

## 2021-10-06 NOTE — Telephone Encounter (Signed)
She was seen at the [facility] this morning.

## 2021-10-07 NOTE — Telephone Encounter (Signed)
Noted by triage.  Patient canceled appt due to ED visit.

## 2021-10-07 NOTE — Progress Notes (Signed)
Error. Pt canceled visit.

## 2021-10-14 ENCOUNTER — Other Ambulatory Visit: Payer: Self-pay | Admitting: Pulmonary Disease

## 2021-10-19 ENCOUNTER — Other Ambulatory Visit: Payer: Self-pay

## 2021-10-19 ENCOUNTER — Telehealth: Payer: Self-pay | Admitting: Pulmonary Disease

## 2021-10-19 MED ORDER — ALBUTEROL SULFATE HFA 108 (90 BASE) MCG/ACT IN AERS: 2.0000 | INHALATION_SPRAY | RESPIRATORY_TRACT | 3 refills | Status: DC | PRN

## 2021-10-19 NOTE — Telephone Encounter (Signed)
Called and spoke to patient, I have sent in albuterol inhaler. Nothing further needed.

## 2021-10-21 ENCOUNTER — Encounter: Payer: Self-pay | Admitting: Nurse Practitioner

## 2021-10-21 ENCOUNTER — Telehealth (INDEPENDENT_AMBULATORY_CARE_PROVIDER_SITE_OTHER): Payer: Managed Care, Other (non HMO) | Admitting: Nurse Practitioner

## 2021-10-21 ENCOUNTER — Other Ambulatory Visit: Payer: Self-pay

## 2021-10-21 DIAGNOSIS — J45901 Unspecified asthma with (acute) exacerbation: Secondary | ICD-10-CM | POA: Insufficient documentation

## 2021-10-21 DIAGNOSIS — J189 Pneumonia, unspecified organism: Secondary | ICD-10-CM

## 2021-10-21 DIAGNOSIS — J4551 Severe persistent asthma with (acute) exacerbation: Secondary | ICD-10-CM | POA: Diagnosis not present

## 2021-10-21 DIAGNOSIS — J4550 Severe persistent asthma, uncomplicated: Secondary | ICD-10-CM

## 2021-10-21 MED ORDER — PREDNISONE 20 MG PO TABS: 40.0000 mg | ORAL_TABLET | Freq: Every day | ORAL | 0 refills | Status: DC

## 2021-10-21 NOTE — Assessment & Plan Note (Signed)
Symptoms had improved before reaction to MRI contrast. Cough resolved. Completed abx course. Will obtain CT chest in 8 weeks for evaluation of resolution, as advised by CTA read.

## 2021-10-21 NOTE — Progress Notes (Signed)
Patient ID: Susan Payne, female     DOB: [birth year], 41 y.o.      MRN: [id number]  No chief complaint on file.   Virtual Visit via Video Note  I connected with Otelia Santee on 10/21/21 at  4:00 PM EST by a video enabled telemedicine application and verified that I am speaking with the correct person using two identifiers.  Location: Patient: Home Provider: Office   I discussed the limitations of evaluation and management by telemedicine and the availability of in person appointments. The patient expressed understanding and agreed to proceed.  History of Present Illness: 41 year old female, never smoker followed for severe persistent asthma and allergic rhinitis. She is a patient of Dr. Patsey Berthold and last seen in office on 09/30/2021. Past medical history significant for GERD, anxiety, depression, POTS, previous sinus surgery.   09/30/2021: OV with Dr. Patsey Berthold. Stable. Continued Symbicort and PRN albuterol. Continue Xolair. F/u 6 months  10/06/2021: ED visit for RSV and associated pneumonia. Tx with z pack and discharged home.   10/21/2021: Today - hospital follow up Patient presents today via virtual visit for hospital follow up after being treated for pneumonia. She completed her abx course and was discharged home on 12/21. She reports her breathing was better and she felt well until Tuesday, when she had a MRI for unrelated issues and thinks she may have had a reaction to the contrast dye as she developed some shortness of breath, chest tightness, and hives. Since then, she has had some improvement but continues to have an increase in her shortness of breath and wheezing. She denies cough, orthopnea, PND, chest pain or lower extremity swelling. Her hives have resolved after taking benadryl. She continues on her Symbicort Twice daily and as needed albuterol. Xolair 150 mg every 2 weeks. She has been using her nebulizer treatments more over the past two days, but has felt a little better  today. Overall, she feels as though she is improving but is not back to her baseline.   Allergies  Allergen Reactions   Cat Hair Extract Shortness Of Breath   Cedar Shortness Of Breath   Dog Epithelium Allergy Skin Test Shortness Of Breath   Elder [Sambucus Nigra] Shortness Of Breath   Gadolinium Hives and Shortness Of Breath    Noted after MRI with contrast.   Molds & Smuts Shortness Of Breath   Sulfa Antibiotics Nausea And Vomiting   Immunization History  Administered Date(s) Administered   Influenza, Seasonal, Injecte, Preservative Fre 08/23/2013   Influenza,inj,Quad PF,6+ Mos 07/01/2014, 07/19/2017, 10/15/2019, 07/17/2020   Influenza-Unspecified 07/01/2014   Moderna SARS-COV2 Booster Vaccination 09/23/2020   Moderna Sars-Covid-2 Vaccination 12/18/2019, 01/14/2020   Pneumococcal Conjugate-13 06/11/2021   Pneumococcal Polysaccharide-23 04/24/2013   Tdap 05/13/2016   Past Medical History:  Diagnosis Date   Anxiety    Asthma    COVID-19 04/19/2021   Fever, body aches.  Resolved   GERD (gastroesophageal reflux disease)    Motion sickness    long car rides   Reflux esophagitis    Wears contact lenses     Tobacco History: Social History   Tobacco Use  Smoking Status Former   Packs/day: 1.00   Years: 18.00   Pack years: 18.00   Types: Cigarettes   Quit date: 2016   Years since quitting: 7.0  Smokeless Tobacco Never   Counseling given: Not Answered   Outpatient Medications Prior to Visit  Medication Sig Dispense Refill   albuterol (ACCUNEB) 0.63 MG/3ML nebulizer solution  Take 1 ampule by nebulization every 6 (six) hours as needed for wheezing.     albuterol (VENTOLIN HFA) 108 (90 Base) MCG/ACT inhaler Inhale 2 puffs into the lungs every 4 (four) hours as needed for wheezing or shortness of breath. 6.7 g 3   ATROVENT HFA 17 MCG/ACT inhaler INHALE 2 PUFFS INTO THE LUNGS EVERY 6 HOURS AS NEEDED FOR WHEEZING 12.9 g 7   azelastine (ASTELIN) 0.1 % nasal spray Place 2  sprays into both nostrils 2 (two) times daily. Use in each nostril as directed 60 mL 2   EPINEPHrine 0.3 mg/0.3 mL IJ SOAJ injection      fluticasone (FLONASE) 50 MCG/ACT nasal spray Place 1 spray into both nostrils daily.     ipratropium (ATROVENT) 0.02 % nebulizer solution Take 0.5 mg by nebulization as needed for wheezing or shortness of breath.     LORazepam (ATIVAN) 0.5 MG tablet SMARTSIG:0.5-1 Tablet(s) By Mouth 1 to 2 Times Daily     methylphenidate (METADATE CD) 20 MG CR capsule Take 20 mg by mouth 2 (two) times daily.     sertraline (ZOLOFT) 50 MG tablet Take 50 mg by mouth daily.     XOLAIR 150 MG/ML prefilled syringe INJECT 300MG  SUBCUTANEOUSLY EVERY 2 WEEKS (DISCARD  UNUSED) 1 mL 6   budesonide-formoterol (SYMBICORT) 160-4.5 MCG/ACT inhaler Inhale 2 puffs into the lungs 2 (two) times daily.     dexlansoprazole (DEXILANT) 60 MG capsule Take by mouth.     HYDROcodone-acetaminophen (NORCO/VICODIN) 5-325 MG tablet Take 1-2 tablets by mouth every 6 (six) hours as needed for moderate pain. 40 tablet 0   traZODone (DESYREL) 50 MG tablet Take 25 mg by mouth at bedtime as needed for sleep.     No facility-administered medications prior to visit.     Review of Systems:   Constitutional: No weight loss or gain, night sweats, fevers, chills, fatigue, or lassitude. HEENT: No headaches, difficulty swallowing, tooth/dental problems, or sore throat. No sneezing, itching, ear ache, nasal congestion, or post nasal drip CV:  No chest pain, orthopnea, PND, swelling in lower extremities, anasarca, dizziness, palpitations, syncope Resp: +shortness of breath with exertion; occasional wheeze. No excess mucus or change in color of mucus. No productive or non-productive. No hemoptysis.  No chest wall deformity GI:  No heartburn, indigestion, abdominal pain, nausea, vomiting, diarrhea, change in bowel habits, loss of appetite, bloody stools.  GU: No dysuria, change in color of urine, urgency or frequency.   No flank pain, no hematuria  Skin: No rash, lesions, ulcerations MSK:  No joint pain or swelling.  No decreased range of motion.  No back pain. Neuro: No dizziness or lightheadedness.  Psych: No depression or anxiety. Mood stable.   Observations/Objective: Patient is well-developed, well-nourished in no acute distress; A&O. Resting comfortably at home with unlabored breathing. Speech is clear and coherent with logical content.   10/06/2021 CXR: lungs clear. No acute cardiopulmonary finding   10/06/2021 CTA Chest: No PE. right middle lobe consolidation, concerning for pneumonia. Recommend follow up CT chest in 8-12 weeks.   Assessment and Plan: Asthma exacerbation Previously stable and improving after pna. Likely related to reaction to MRI contrast dye. Added to allergy list. Acute reaction appears to have resolved. Respiratory status improving. Will tx with prednisone 40 mg for 5 days. Advised to notify or seek emergency care if symptoms worsen. Continue Symbicort, Xolair, rescue inhalers, and PRN nebs.    CAP (community acquired pneumonia) Symptoms had improved before reaction to MRI contrast. Cough  resolved. Completed abx course. Will obtain CT chest in 8 weeks for evaluation of resolution, as advised by CTA read.     Follow Up Instructions: Follow up in 1 month with Dr. Patsey Berthold. If symptoms do not improve or worsen, please contact office for sooner follow up or seek emergency care.  I discussed the assessment and treatment plan with the patient. The patient was provided an opportunity to ask questions and all were answered. The patient agreed with the plan and demonstrated an understanding of the instructions.   The patient was advised to call back or seek an in-person evaluation if the symptoms worsen or if the condition fails to improve as anticipated.  I provided 21 minutes of non-face-to-face time during this encounter.   Clayton Bibles, NP

## 2021-10-21 NOTE — Assessment & Plan Note (Addendum)
Previously stable and improving after pna. Likely related to reaction to MRI contrast dye. Added to allergy list. Acute reaction appears to have resolved. Respiratory status improving. Will tx with prednisone 40 mg for 5 days. Advised to notify or seek emergency care if symptoms worsen. Continue Symbicort, Xolair, rescue inhalers, and PRN nebs.

## 2021-10-25 ENCOUNTER — Other Ambulatory Visit: Payer: Self-pay | Admitting: Pulmonary Disease

## 2021-10-26 ENCOUNTER — Telehealth: Payer: Self-pay | Admitting: Pharmacy Technician

## 2021-10-26 ENCOUNTER — Other Ambulatory Visit (HOSPITAL_COMMUNITY): Payer: Self-pay

## 2021-10-26 MED ORDER — SPIRIVA RESPIMAT 2.5 MCG/ACT IN AERS: 2.0000 | INHALATION_SPRAY | Freq: Every day | RESPIRATORY_TRACT | 1 refills | Status: DC

## 2021-10-26 NOTE — Telephone Encounter (Signed)
Patient Advocate Encounter  Received notification from [employer] [facility]) that prior authorization for [facility] 2.5MCG is required.   PA submitted on 1.10.23 Key [id number] Status is pending   [facility] will continue to follow  Ricke Hey, CPhT Patient Advocate [facility] Phone: [phone] Fax:  [phone]

## 2021-10-26 NOTE — Telephone Encounter (Signed)
Received notification from [employer] ([employer]) regarding a prior authorization for SPIRIVA 2.5. Authorization NOT NEEDED AS PATIENT HAS EXCEEDED RETAIL FILLS. LAST FILLED 12.30.22

## 2021-10-27 MED ORDER — SPIRIVA RESPIMAT 2.5 MCG/ACT IN AERS: 2.0000 | INHALATION_SPRAY | Freq: Every day | RESPIRATORY_TRACT | 3 refills | Status: DC

## 2021-10-27 NOTE — Telephone Encounter (Signed)
Noted.Script sent to [employer]. Email sent to patient to make aware.   Nothing further needed at this time.

## 2021-10-27 NOTE — Addendum Note (Signed)
Addended by: Kerin Ransom on: 10/27/2021 10:29 AM   Modules accepted: Orders

## 2021-11-11 ENCOUNTER — Telehealth: Payer: Self-pay | Admitting: Pulmonary Disease

## 2021-11-11 NOTE — Telephone Encounter (Signed)
Received message from [employer] stating that they have made several attempts to contact patient to refill xolair.    Lm for patient.

## 2021-11-12 NOTE — Telephone Encounter (Signed)
Patient is aware of below message and voiced her understanding.  She stated that she would contact [employer].  Nothing further needed at this time.

## 2021-12-27 ENCOUNTER — Ambulatory Visit: Payer: Managed Care, Other (non HMO)

## 2022-01-11 ENCOUNTER — Ambulatory Visit: Payer: Managed Care, Other (non HMO) | Admitting: Pulmonary Disease

## 2022-03-19 ENCOUNTER — Ambulatory Visit
Admission: EM | Admit: 2022-03-19 | Discharge: 2022-03-19 | Disposition: A | Discharge status: Home / Self Care | Payer: Managed Care, Other (non HMO)

## 2022-03-19 ENCOUNTER — Ambulatory Visit (INDEPENDENT_AMBULATORY_CARE_PROVIDER_SITE_OTHER): Payer: Managed Care, Other (non HMO)

## 2022-03-19 ENCOUNTER — Encounter: Payer: Self-pay | Admitting: Emergency Medicine

## 2022-03-19 DIAGNOSIS — S67194A Crushing injury of right ring finger, initial encounter: Secondary | ICD-10-CM

## 2022-03-19 DIAGNOSIS — S6710XA Crushing injury of unspecified finger(s), initial encounter: Secondary | ICD-10-CM

## 2022-03-19 NOTE — ED Provider Notes (Signed)
[facility]    CSN: [id number] Arrival date & time: 03/19/22  1247      History   Chief Complaint Chief Complaint  Patient presents with   Finger Injury    Right 4th    HPI Susan Payne is a 41 y.o. female.   HPI  41 year old female here for evaluation of finger injury.  Patient reports that she had the tip of her right fourth finger slammed in the door by her child several hours ago and she is here for evaluation.  She states that the tip of her finger is numb and she is having pain at the last joint distal to the tip.  There is no significant swelling or bruising noted.  She did apply ice for approximately an hour which has helped with the discomfort.  She has most of her range of motion but cannot fully make a fist due to the swelling of her fingertip.  Past Medical History:  Diagnosis Date   Anxiety    Asthma    COVID-19 04/19/2021   Fever, body aches.  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   Vocal cord dysfunction 09/16/2019   Environmental allergies 09/16/2019   Depression 06/25/2019   GERD (gastroesophageal reflux disease) 06/25/2019   Mild persistent asthma 05/10/2016   Allergic rhinitis 04/09/2014   Healthcare maintenance 08/24/2013   Anxiety 04/24/2013   Asthma without status asthmaticus 02/25/2013   Insomnia 02/25/2013    Past Surgical History:  Procedure Laterality Date   CESAREAN SECTION     ENDOSCOPIC CONCHA BULLOSA RESECTION Bilateral 06/22/2021   Procedure: ENDOSCOPIC CONCHA BULLOSA RESECTION;  Surgeon: Geanie Logan, MD;  Location: [facility];  Service: ENT;  Laterality: Bilateral;   NASAL SEPTOPLASTY W/ TURBINOPLASTY Bilateral 06/22/2021   Procedure: NASAL  SEPTOPLASTY WITh BILATERAL SUBMUCOUS RESECTION;  Surgeon: Geanie Logan, MD;  Location: [facility];  Service: ENT;  Laterality: Bilateral;  covid + 04-19-21    OB History   No obstetric history on file.      Home Medications    Prior to Admission medications   Medication Sig Start Date End Date Taking? Authorizing Provider  LORazepam (ATIVAN) 0.5 MG tablet SMARTSIG:0.5-1 Tablet(s) By Mouth 1 to 2 Times Daily 03/14/20  Yes [provider]  methylphenidate (METADATE CD) 20 MG CR capsule Take 20 mg by mouth 2 (two) times daily.   Yes [provider]  sertraline (ZOLOFT) 50 MG tablet Take 50 mg by mouth daily.   Yes [provider]  Tiotropium Bromide Monohydrate (SPIRIVA RESPIMAT) 2.5 MCG/ACT AERS Inhale 2 puffs into the lungs daily. 10/27/21  Yes Salena Saner, MD  albuterol (ACCUNEB) 0.63 MG/3ML nebulizer solution Take 1 ampule by nebulization every 6 (six) hours as needed for wheezing.    [provider]  albuterol (VENTOLIN HFA) 108 (90 Base) MCG/ACT inhaler Inhale 2 puffs into the lungs every 4 (four) hours as needed for wheezing or shortness of breath. 10/19/21   Salena Saner, MD  ATROVENT HFA 17 MCG/ACT inhaler INHALE 2 PUFFS INTO THE LUNGS EVERY 6 HOURS AS NEEDED FOR WHEEZING 05/10/21   Salena Saner, MD  azelastine (ASTELIN) 0.1 % nasal spray Place  2 sprays into both nostrils 2 (two) times daily. Use in each nostril as directed 02/18/20   Salena Saner, MD  azithromycin [facility]) 500 MG tablet Take by mouth. 10/06/21   [provider]  budesonide-formoterol (SYMBICORT) 160-4.5 MCG/ACT inhaler 2 puffs    [provider]  chlorpheniramine-HYDROcodone (TUSSIONEX) 10-8 MG/5ML SUER hydrocodone 10 mg-chlorpheniramine 8 mg/5 mL oral susp extend.rel 12hr 10/05/21   [provider]  EPINEPHrine 0.3 mg/0.3 mL IJ SOAJ injection  03/21/19   [provider]  FLOVENT HFA 110 MCG/ACT inhaler Inhale 2 puffs  into the lungs 2 (two) times daily. 10/13/21   [provider]  fluticasone (FLONASE) 50 MCG/ACT nasal spray Place 1 spray into both nostrils daily.    [provider]  ipratropium (ATROVENT) 0.02 % nebulizer solution Take 0.5 mg by nebulization as needed for wheezing or shortness of breath.    [provider]  predniSONE (DELTASONE) 20 MG tablet Take 2 tablets (40 mg total) by mouth daily with breakfast. 10/21/21   Cobb, Ruby Cola, NP  Geoffry Paradise 150 MG/ML prefilled syringe INJECT  SUBCUTANEOUSLY EVERY 2 WEEKS (DISCARD  UNUSED) 08/13/21   Salena Saner, MD  loratadine (CLARITIN) 10 MG tablet Take by mouth.  12/09/19  [provider]  zafirlukast (ACCOLATE) 20 MG tablet Take 1 tablet (20 mg total) by mouth 2 (two) times daily before a meal. Patient not taking: Reported on 07/07/2020 11/29/19 08/21/20  Salena Saner, MD    Family History Family History  Problem Relation Age of Onset   Asthma Mother     Social History Social History   Tobacco Use   Smoking status: Former    Packs/day: 1.00    Years: 18.00    Pack years: 18.00    Types: Cigarettes    Quit date: 2016    Years since quitting: 7.4   Smokeless tobacco: Never  Vaping Use   Vaping Use: Never used  Substance Use Topics   Alcohol use: Yes    Comment: occasional    Drug use: Never     Allergies   Cat hair extract, Cedar, Dog epithelium allergy skin test, Elder [sambucus nigra], Gadolinium, Molds & smuts, and Sulfa antibiotics   Review of Systems Review of Systems  Musculoskeletal:  Positive for arthralgias and myalgias. Negative for joint swelling.  Skin:  Negative for color change.  Neurological:  Positive for numbness. Negative for weakness.  Hematological: Negative.   Psychiatric/Behavioral: Negative.      Physical Exam Triage Vital Signs ED Triage Vitals  Enc Vitals Group     BP 03/19/22 1325 (!) 172/96     Pulse Rate 03/19/22 1325 85     Resp 03/19/22 1325 14      Temp 03/19/22 1325 98.2 F (36.8 C)     Temp Source 03/19/22 1325 Oral     SpO2 03/19/22 1325 100 %     Weight 03/19/22 1322 110 lb (49.9 kg)     Height 03/19/22 1322  (1.626 m)     Head Circumference --      Peak Flow --      Pain Score 03/19/22 1322 7     Pain Loc --      Pain Edu? --      Excl. in GC? --    No data found.  Updated Vital Signs BP (!) 172/96 (BP Location: Left Arm)   Pulse 85   Temp 98.2 F (36.8 C) (Oral)   Resp 14  Ht 5\' 4"  (1.626 m)   Wt 110 lb (49.9 kg)   LMP 03/17/2022 (Approximate)   SpO2 100%   BMI 18.88 kg/m   Visual Acuity Right Eye Distance:   Left Eye Distance:   Bilateral Distance:    Right Eye Near:   Left Eye Near:    Bilateral Near:     Physical Exam Vitals and nursing note reviewed.  Constitutional:      Appearance: Normal appearance. She is not ill-appearing.  HENT:     Head: Normocephalic and atraumatic.  Musculoskeletal:        General: Tenderness and signs of injury present. No swelling or deformity.  Skin:    General: Skin is warm and dry.     Capillary Refill: Capillary refill takes less than 2 seconds.     Findings: No bruising or erythema.  Neurological:     General: No focal deficit present.     Mental Status: She is alert and oriented to person, place, and time.  Psychiatric:        Mood and Affect: Mood normal.        Behavior: Behavior normal.        Thought Content: Thought content normal.        Judgment: Judgment normal.     UC Treatments / Results  Labs (all labs ordered are listed, but only abnormal results are displayed) Labs Reviewed - No data to display  EKG   Radiology DG Finger Ring Right  Result Date: 03/19/2022 CLINICAL DATA:  slammed car door on right 4th finger this morning EXAM: RIGHT RING FINGER 2+V COMPARISON:  None Available. FINDINGS: No acute fracture or dislocation. Joint spaces and alignment are maintained. No area of erosion or osseous destruction. No unexpected  radiopaque foreign body. Soft tissues are unremarkable. IMPRESSION: No acute fracture or dislocation. Electronically Signed   By: 05/19/2022 M.D.   On: 03/19/2022 13:49    Procedures Procedures (including critical care time)  Medications Ordered in UC Medications - No data to display  Initial Impression / Assessment and Plan / UC Course  I have reviewed the triage vital signs and the nursing notes.  Pertinent labs & imaging results that were available during my care of the patient were reviewed by me and considered in my medical decision making (see chart for details).  Patient is a very pleasant, nontoxic-appearing 41 year old female here for evaluation of right fourth fingertip injury as outlined HPI above.  On exam patient's right forefinger is in normal anatomical alignment.  She is complaining of numbness from the DIP joint to the tip but does have good flexion and extension against resistance.  She has no pain with palpation of the MCP joint, PIP joint, or proximal middle phalanx.  She does have tenderness with palpation of the DIP joint.  The distal phalanx is also nontender.  Her cap refill is less than 2 seconds.  She can almost completely close her fist but not fully and she attributes this to the swelling in her finger.  We will obtain radiograph of right fourth finger to rule out any bony abnormality.  Right fourth finger x-ray independently reviewed and evaluated by me.  Impression: No evidence of fracture or dislocation.  Soft tissues are unremarkable.  Radiology overread is pending. Radiology impression states no acute fracture or dislocation and joint spaces are maintained with normal alignment.  I will discharge the patient home with a diagnosis of contusion of her right fourth finger and will  provide a finger splint to help protect her finger from further injury as well as provide comfort.  Patient to use Tylenol and ibuprofen over-the-counter as needed for pain and she  can apply ice for 20 minutes at a time 2-3 times a day to help with pain as well.   Final Clinical Impressions(s) / UC Diagnoses   Final diagnoses:  Crushing injury of finger, initial encounter     Discharge Instructions      Your x-rays today did not demonstrate any broken bones.  I do believe you have a contusion secondary to the crush injury from your finger being closed in the car door.  Wear the splint as needed to help protect your finger from further injury as well as to provide some measure of comfort.  You can take over-the-counter Tylenol and ibuprofen according to the package instructions as needed for pain and inflammation.  You may also apply ice to your finger for 20 minutes at a time 2-3 times a day.  Please return for reevaluation, or see your PCP, for continued or worsening symptoms.     ED Prescriptions   None    PDMP not reviewed this encounter.   Becky Augustayan, Gianna Calef, NP 03/19/22 1358

## 2022-03-19 NOTE — ED Triage Notes (Signed)
Patient states that she jammed the car door on her right 4th finger this morning.

## 2022-03-19 NOTE — Discharge Instructions (Addendum)
Your x-rays today did not demonstrate any broken bones.  I do believe you have a contusion secondary to the crush injury from your finger being closed in the car door.  Wear the splint as needed to help protect your finger from further injury as well as to provide some measure of comfort.  You can take over-the-counter Tylenol and ibuprofen according to the package instructions as needed for pain and inflammation.  You may also apply ice to your finger for 20 minutes at a time 2-3 times a day.  Please return for reevaluation, or see your PCP, for continued or worsening symptoms.

## 2022-05-02 MED ORDER — BUDESONIDE-FORMOTEROL FUMARATE 160-4.5 MCG/ACT IN AERO: INHALATION_SPRAY | RESPIRATORY_TRACT | 2 refills | Status: DC

## 2022-06-09 ENCOUNTER — Encounter: Payer: Self-pay | Admitting: Pulmonary Disease

## 2022-06-09 ENCOUNTER — Ambulatory Visit (INDEPENDENT_AMBULATORY_CARE_PROVIDER_SITE_OTHER): Payer: Managed Care, Other (non HMO) | Admitting: Pulmonary Disease

## 2022-06-09 VITALS — BP 140/90 | HR 88 | Temp 97.9°F | Ht 65.0 in | Wt 102.0 lb

## 2022-06-09 DIAGNOSIS — J309 Allergic rhinitis, unspecified: Secondary | ICD-10-CM | POA: Diagnosis not present

## 2022-06-09 DIAGNOSIS — J45909 Unspecified asthma, uncomplicated: Secondary | ICD-10-CM | POA: Diagnosis not present

## 2022-06-09 NOTE — Patient Instructions (Signed)
I am glad to see you doing well.  We will see you in follow-up in 6 months time call sooner should any new problems arise.  Let us know if you notice any change in your breathing or if you develop persistent cough or any other symptoms that would necessitate a  sooner appointment.

## 2022-06-09 NOTE — Progress Notes (Signed)
Subjective:    Patient ID: Susan Payne, female    DOB: [birth year], 41 y.o.   MRN: [id number] Patient Care Team: Chauncey Fischer, MD as PCP - General (Family Medicine)  Chief Complaint  Patient presents with   Follow-up   HPI Patient is a 41 year old very remote former smoker who follows here for the issue of asthma.  Last visit here was on 30 September 2021.  Patient at that time had her Xolair continue to reduce dose as the patient had lost significant weight after being weaned off of steroids.  Recall that she had severe persistent asthma and was steroid-dependent and Xolair dependent for period of time.  She recently had her home evaluated and was noted to have high levels of molds in the home (it is a 1940s home) and Aspergillus.  After remediation for these issues her symptoms have been better.  She is actually taken herself off of Xolair.  Continues to use Symbicort 160/4.5, 2 inhalations twice a day though she does admit that occasionally she misses the second dose of the day.  She is using ipratropium as needed when she notes dry cough.  Very rare use of albuterol.  Nasal symptoms are controlled with nasal hygiene.  She does not endorse any fevers, chills or sweats.  No recent cough or sputum production.  No hemoptysis.  No chest pain, lower extremity edema or calf tenderness.  Overall she feels well and looks well.   Review of Systems A 10 point review of systems was performed and it is as noted above otherwise negative.  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   Vocal cord dysfunction 09/16/2019   Environmental allergies 09/16/2019   Depression 06/25/2019   GERD (gastroesophageal reflux disease) 06/25/2019   Mild persistent asthma 05/10/2016   Allergic rhinitis 04/09/2014   Healthcare maintenance 08/24/2013   Anxiety 04/24/2013   Asthma without status  asthmaticus 02/25/2013   Insomnia 02/25/2013   Social History   Tobacco Use   Smoking status: Former    Packs/day: 1.00    Years: 18.00    Total pack years: 18.00    Types: Cigarettes    Quit date: 2016    Years since quitting: 7.6   Smokeless tobacco: Never  Substance Use Topics   Alcohol use: Yes    Comment: occasional    Allergies  Allergen Reactions   Cat Hair Extract Shortness Of Breath   Cedar Shortness Of Breath   Dog Epithelium Allergy Skin Test Shortness Of Breath   Elder [Sambucus Nigra] Shortness Of Breath   Gadolinium Hives and Shortness Of Breath    Noted after MRI with contrast.   Gadolinium Derivatives Hives and Shortness Of Breath    Noted after MRI with contrast.   Molds & Smuts Shortness Of Breath   Sulfa Antibiotics Nausea And Vomiting   Current Meds  Medication Sig   albuterol (VENTOLIN HFA) 108 (90 Base) MCG/ACT inhaler Inhale 2 puffs into the lungs every 4 (four) hours as needed for wheezing or shortness of breath.   ATROVENT HFA 17 MCG/ACT inhaler INHALE 2 PUFFS INTO THE LUNGS EVERY 6 HOURS AS NEEDED FOR WHEEZING   budesonide-formoterol (SYMBICORT) 160-4.5 MCG/ACT inhaler 2 puffs   fluticasone (FLONASE) 50 MCG/ACT nasal spray Place 1 spray into both nostrils daily.   LORazepam (ATIVAN) 0.5 MG tablet SMARTSIG:0.5-1 Tablet(s) By Mouth 1 to 2  Times Daily   methylphenidate (METADATE CD) 20 MG CR capsule Take 20 mg by mouth 2 (two) times daily.   sertraline (ZOLOFT) 50 MG tablet Take 50 mg by mouth daily.   Immunization History  Administered Date(s) Administered   Influenza, Seasonal, Injecte, Preservative Fre 08/23/2013   Influenza,inj,Quad PF,6+ Mos 07/01/2014, 07/19/2017, 10/15/2019, 07/17/2020, 08/16/2021   Influenza-Unspecified 07/01/2014   Moderna SARS-COV2 Booster Vaccination 09/23/2020   Moderna Sars-Covid-2 Vaccination 12/18/2019, 01/14/2020   Pneumococcal Conjugate-13 06/11/2021   Pneumococcal Polysaccharide-23 04/24/2013   Tdap 05/13/2016       Objective:   Physical Exam BP (!) 140/90 (BP Location: Left Arm, Patient Position: Sitting, Cuff Size: Normal)   Pulse 88   Temp 97.9 F (36.6 C) (Oral)   Ht 5\' 5"  (1.651 m)   Wt 102 lb (46.3 kg)   SpO2 100%   BMI 16.97 kg/m  GENERAL: Well-developed, thin woman, no acute distress.  Fully ambulatory,no conversational dyspnea. HEAD: Normocephalic, atraumatic.  EYES: Pupils equal, round, reactive to light.  No scleral icterus.  MOUTH: Nose/mouth/throat not examined due to masking requirements for COVID 19.   NECK: Supple. No thyromegaly. Trachea midline. No JVD.  No adenopathy. PULMONARY: Good air entry bilaterally.  No adventitious sounds. CARDIOVASCULAR: S1 and S2. Regular rate and rhythm.  No rubs, murmurs or gallops heard. ABDOMEN: Benign. MUSCULOSKELETAL: No joint deformity, no clubbing, no edema.  NEUROLOGIC: No focal deficits.  Fully ambulatory.  Speech is fluent. SKIN: Intact,warm,dry.  Limited exam shows no rashes. PSYCH: Mood and behavior normal.     Assessment & Plan:     ICD-10-CM   1. Persistent asthma without complication, unspecified asthma severity  J45.909    Previously noted to be severe symptoms have abated Likely triggered by sensitivities to mold. 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 Allergic rhinitis, unspecified seasonality, unspecified trigger  J30.9    Continue nasal hygiene     Patient appears to be doing well.  She has undergone mold remediation in the home.  It is likely that she may have had ABPA early on in her course (was being followed elsewhere) she discontinued Xolair on her own.  He is currently on Symbicort and as needed albuterol or ipratropium.  Continue same.  We will see her in follow-up in 6 months time she is to contact prior to that time should any new problems arise.   [place], MD Advanced Bronchoscopy PCCM [facility]    *This note was dictated using voice  recognition software/Dragon.  Despite best efforts to proofread, errors can occur which can change the meaning. Any transcriptional errors that result from this process are unintentional and may not be fully corrected at the time of dictation.

## 2022-06-19 ENCOUNTER — Other Ambulatory Visit: Payer: Self-pay | Admitting: Pulmonary Disease

## 2022-07-14 ENCOUNTER — Encounter: Payer: Self-pay | Admitting: Pulmonary Disease

## 2022-07-15 NOTE — Telephone Encounter (Signed)
Dr. Patsey Berthold, please advise if okay to refill Azelastine and Atrovent. Thanks

## 2022-07-15 NOTE — Telephone Encounter (Signed)
I believe all nasal sprays come through Dr. Donneta Romberg.  He is the allergist.

## 2022-07-20 MED ORDER — BUDESONIDE-FORMOTEROL FUMARATE 160-4.5 MCG/ACT IN AERO: INHALATION_SPRAY | RESPIRATORY_TRACT | 2 refills | Status: DC

## 2022-07-20 MED ORDER — IPRATROPIUM BROMIDE 0.02 % IN SOLN: 0.5000 mg | RESPIRATORY_TRACT | 2 refills | Status: DC | PRN

## 2022-08-02 MED ORDER — ATROVENT HFA 17 MCG/ACT IN AERS: INHALATION_SPRAY | RESPIRATORY_TRACT | 5 refills | Status: DC

## 2023-03-28 IMAGING — CR DG FINGER RING 2+V*R*
3 series · 3 of 3 positions shown · non-contrast
Comparison: None Available.

CLINICAL DATA: slammed car door on right 4th finger this morning

EXAM:
RIGHT RING FINGER 2+V

[finger ap]
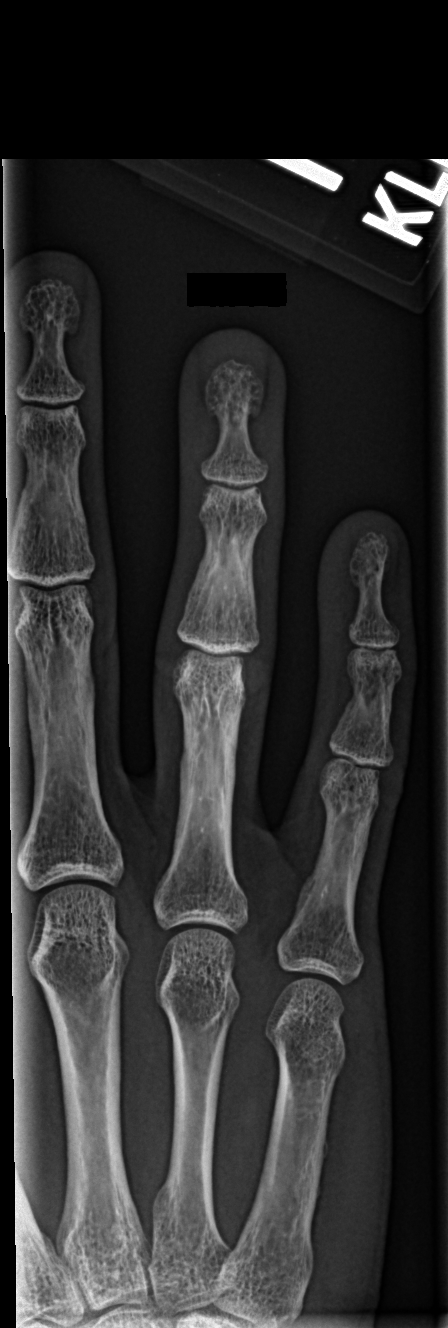

[finger obl]
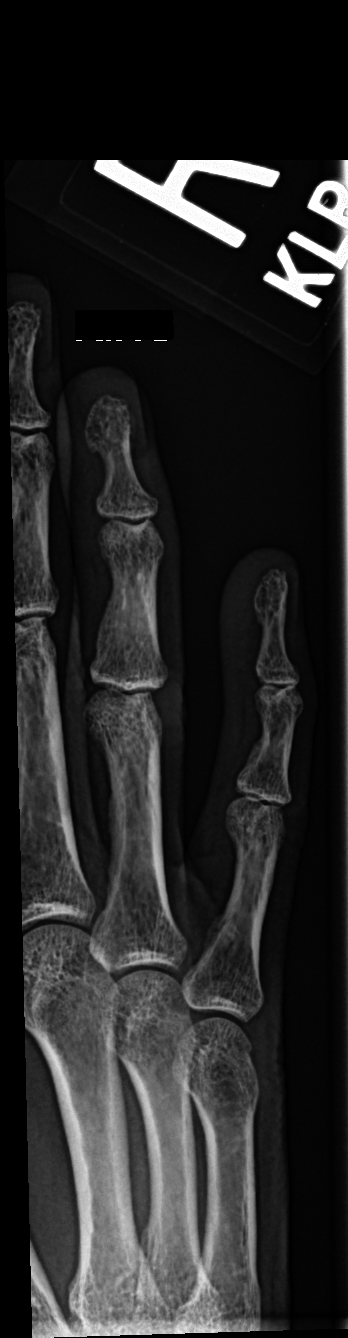

[finger lat]
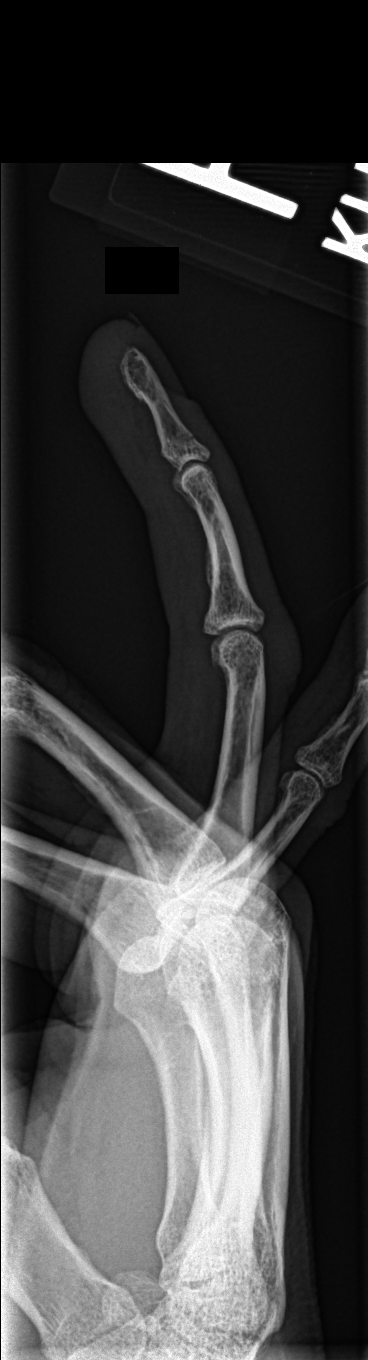

[3 of 3 positions shown; findings below may reference images not displayed]

FINDINGS: No acute fracture or dislocation. Joint spaces and alignment are
maintained. No area of erosion or osseous destruction. No unexpected
radiopaque foreign body. Soft tissues are unremarkable.
IMPRESSION: No acute fracture or dislocation.

## 2023-05-07 ENCOUNTER — Other Ambulatory Visit: Payer: Self-pay | Admitting: Pulmonary Disease

## 2023-05-23 ENCOUNTER — Encounter: Payer: Managed Care, Other (non HMO) | Admitting: Family Medicine

## 2023-05-23 ENCOUNTER — Other Ambulatory Visit: Payer: Self-pay

## 2023-05-23 MED ORDER — BUDESONIDE-FORMOTEROL FUMARATE 160-4.5 MCG/ACT IN AERO: INHALATION_SPRAY | RESPIRATORY_TRACT | 0 refills | Status: DC

## 2023-05-23 NOTE — Progress Notes (Signed)
Error Had refills already  [facility]

## 2023-05-24 ENCOUNTER — Other Ambulatory Visit: Payer: Self-pay

## 2023-05-24 MED ORDER — BUDESONIDE-FORMOTEROL FUMARATE 160-4.5 MCG/ACT IN AERO: INHALATION_SPRAY | RESPIRATORY_TRACT | 0 refills | Status: DC

## 2023-06-20 ENCOUNTER — Encounter: Payer: Self-pay | Admitting: Pulmonary Disease

## 2023-06-20 MED ORDER — SYMBICORT 160-4.5 MCG/ACT IN AERO: INHALATION_SPRAY | RESPIRATORY_TRACT | 3 refills | Status: DC

## 2023-06-20 MED ORDER — BUDESONIDE-FORMOTEROL FUMARATE 160-4.5 MCG/ACT IN AERO: INHALATION_SPRAY | RESPIRATORY_TRACT | 3 refills | Status: DC

## 2023-06-20 NOTE — Addendum Note (Signed)
Addended by: Bonney Leitz on: 06/20/2023 02:49 PM   Modules accepted: Orders

## 2023-06-21 ENCOUNTER — Other Ambulatory Visit: Payer: Self-pay | Admitting: Oncology

## 2023-06-21 DIAGNOSIS — Z006 Encounter for examination for normal comparison and control in clinical research program: Secondary | ICD-10-CM

## 2023-06-29 ENCOUNTER — Encounter: Payer: Self-pay | Admitting: Pulmonary Disease

## 2023-06-29 ENCOUNTER — Ambulatory Visit
Admission: RE | Admit: 2023-06-29 | Discharge: 2023-06-29 | Disposition: A | Discharge status: Home / Self Care | Payer: Managed Care, Other (non HMO) | Attending: Pulmonary Disease | Admitting: Pulmonary Disease

## 2023-06-29 ENCOUNTER — Ambulatory Visit
Admission: RE | Admit: 2023-06-29 | Discharge: 2023-06-29 | Disposition: A | Discharge status: Home / Self Care | Payer: Managed Care, Other (non HMO) | Source: Ambulatory Visit | Attending: Pulmonary Disease | Admitting: Pulmonary Disease

## 2023-06-29 ENCOUNTER — Ambulatory Visit (INDEPENDENT_AMBULATORY_CARE_PROVIDER_SITE_OTHER): Payer: Managed Care, Other (non HMO) | Admitting: Pulmonary Disease

## 2023-06-29 VITALS — BP 130/86 | HR 84 | Temp 97.6°F | Ht 65.0 in | Wt 103.0 lb

## 2023-06-29 DIAGNOSIS — R634 Abnormal weight loss: Secondary | ICD-10-CM

## 2023-06-29 DIAGNOSIS — J4540 Moderate persistent asthma, uncomplicated: Secondary | ICD-10-CM

## 2023-06-29 DIAGNOSIS — R0602 Shortness of breath: Secondary | ICD-10-CM | POA: Diagnosis not present

## 2023-06-29 NOTE — Progress Notes (Signed)
Subjective:    Patient ID: Susan Payne, female    DOB: March 20, 1981, 42 y.o.   MRN: [id number]  Patient Care Team: Chauncey Fischer, MD as PCP - General (Family Medicine)  Chief Complaint  Patient presents with   Follow-up    HPI Patient is a 42 year old  remote former smoker who follows here for the issue of moderate persistent asthma previously classified as severe. Last visit here was on 09 June 2022.  At that time the patient had discontinued Xolair on her own. Recall that she had severe persistent asthma and was steroid-dependent and Xolair dependent for period of time. She  had her home evaluated and was noted to have high levels of molds in the home (it is a 1940s home) and Aspergillus. After remediation for these issues her symptoms have been better.  She continues to use Symbicort 160/4.5, 2 inhalations twice a day though she does admit that occasionally she misses the second dose of the day. She is using ipratropium as needed when she notes dry cough. Very rare use of albuterol.  She does not note that albuterol really helps her and feels that ipratropium is more effective on an as-needed basis.  Nasal symptoms are controlled with nasal hygiene. She does not endorse any fevers, chills or sweats. No recent cough or sputum production. No hemoptysis. No chest pain, lower extremity edema or calf tenderness. Overall she feels well and looks well.   She had noted some weight loss and wonders if she needs a chest x-ray as she is concerned due to her prior remote smoking history.  It appears that she was taking medication for migraines that did have the side effect of weight loss and she has actually started to gain weight back after stopping this medication.  However it is also noted that she is on methylphenidate for ADHD and this also can cause significant weight loss.   Review of Systems A 10 point review of systems was performed and it is as noted above otherwise negative.   Patient  Active Problem List   Diagnosis Date Noted   Asthma exacerbation 10/21/2021   CAP (community acquired pneumonia) 10/21/2021   Elevated IgE level 09/16/2019   Vocal cord dysfunction 09/16/2019   Environmental allergies 09/16/2019   Depression 06/25/2019   GERD (gastroesophageal reflux disease) 06/25/2019   Mild persistent asthma 05/10/2016   Allergic rhinitis 04/09/2014   Healthcare maintenance 08/24/2013   Anxiety 04/24/2013   Asthma without status asthmaticus 02/25/2013   Insomnia 02/25/2013    Social History   Tobacco Use   Smoking status: Former    Current packs/day: 0.00    Average packs/day: 1 pack/day for 18.0 years (18.0 ttl pk-yrs)    Types: Cigarettes    Start date: 22    Quit date: 2016    Years since quitting: 8.7   Smokeless tobacco: Never  Substance Use Topics   Alcohol use: Yes    Comment: occasional     Allergies  Allergen Reactions   Cat Hair Extract Shortness Of Breath   Cedar Shortness Of Breath   Dog Epithelium (Canis Lupus Familiaris) Shortness Of Breath   Elder [Sambucus Nigra] Shortness Of Breath   Gadolinium Hives and Shortness Of Breath    Noted after MRI with contrast.   Gadolinium Derivatives Hives and Shortness Of Breath    Noted after MRI with contrast.   Molds & Smuts Shortness Of Breath   Sulfa Antibiotics Nausea And Vomiting    Current Meds  Medication Sig   albuterol (VENTOLIN HFA) 108 (90 Base) MCG/ACT inhaler INHALE 2 PUFFS INTO THE LUNGS EVERY 4 HOURS AS NEEDED FOR WHEEZING OR SHORTNESS OF BREATH.   azelastine (ASTELIN) 0.1 % nasal spray Place 2 sprays into both nostrils 2 (two) times daily. Use in each nostril as directed   budesonide (PULMICORT) 0.5 MG/2ML nebulizer solution 0.5 mg.   citalopram (CELEXA) 10 MG tablet Take 10 mg by mouth daily.   fluticasone (FLONASE) 50 MCG/ACT nasal spray Place 1 spray into both nostrils daily.   ipratropium (ATROVENT HFA) 17 MCG/ACT inhaler INHALE 2 PUFFS INTO THE LUNGS EVERY 6 HOURS AS  NEEDED FOR WHEEZING   ipratropium (ATROVENT) 0.02 % nebulizer solution Take 2.5 mLs (0.5 mg total) by nebulization as needed for wheezing or shortness of breath.   LORazepam (ATIVAN) 0.5 MG tablet SMARTSIG:0.5-1 Tablet(s) By Mouth 1 to 2 Times Daily   methylphenidate (METADATE CD) 20 MG CR capsule Take 20 mg by mouth daily in the afternoon.   SYMBICORT 160-4.5 MCG/ACT inhaler INHALE 2 PUFFS INTO THE LUNGS TWICE DAILY   Tiotropium Bromide Monohydrate (SPIRIVA RESPIMAT) 2.5 MCG/ACT AERS Inhale 2 puffs into the lungs daily.    Immunization History  Administered Date(s) Administered   Influenza, Seasonal, Injecte, Preservative Fre 08/23/2013   Influenza,inj,Quad PF,6+ Mos 07/01/2014, 07/19/2017, 10/15/2019, 07/17/2020, 08/16/2021   Influenza-Unspecified 07/01/2014   Moderna SARS-COV2 Booster Vaccination 09/23/2020   Moderna Sars-Covid-2 Vaccination 12/18/2019, 01/14/2020   Pneumococcal Conjugate-13 06/11/2021   Pneumococcal Polysaccharide-23 04/24/2013   Tdap 05/13/2016        Objective:     BP 130/86 (BP Location: Left Arm, Patient Position: Sitting, Cuff Size: Normal)   Pulse 84   Temp 97.6 F (36.4 C) (Temporal)   Ht 5\' 5"  (1.651 m)   Wt 103 lb (46.7 kg)   SpO2 100%   BMI 17.14 kg/m   SpO2: 100 %  GENERAL: Well-developed, thin woman, no acute distress.  Fully ambulatory,no conversational dyspnea. HEAD: Normocephalic, atraumatic.  EYES: Pupils equal, round, reactive to light.  No scleral icterus.  MOUTH: Dentition intact.  Oral mucosa moist.  No thrush. NECK: Supple. No thyromegaly. Trachea midline. No JVD.  No adenopathy. PULMONARY: Good air entry bilaterally.  No adventitious sounds. CARDIOVASCULAR: S1 and S2. Regular rate and rhythm.  No rubs, murmurs or gallops heard. ABDOMEN: Benign. MUSCULOSKELETAL: No joint deformity, no clubbing, no edema.  NEUROLOGIC: No focal deficits.  Fully ambulatory.  Speech is fluent. SKIN: Intact,warm,dry.  Limited exam shows no  rashes. PSYCH: Mood and behavior normal.    Chest x-ray performed today, no acute disease, hyperinflation consistent with asthma:   Assessment & Plan:     ICD-10-CM   1. Moderate persistent asthma without complication  J45.40 DG Chest 2 View   She appears to be well controlled at present Continue Symbicort 160/4.5, 2 puffs twice a day Continue as needed ipratropium Chest x-ray today:NAD    2. Shortness of breath  R06.02    Intermittent with exacerbations Chest x-ray with no active disease    3. Weight loss  R63.4    Suspect due to methylphenidate Prior history of smoking Obtained chest x-ray No evidence of acute disease     Orders Placed This Encounter  Procedures   DG Chest 2 View    Standing Status:   Future    Number of Occurrences:   1    Standing Expiration Date:   06/28/2024    Order Specific Question:   Reason for Exam (SYMPTOM  OR DIAGNOSIS REQUIRED)    Answer:   asthma,Sob and weight loss    Order Specific Question:   Is patient pregnant?    Answer:   No    Order Specific Question:   Preferred imaging location?    Answer:   [facility]   Patient appears to be well compensated at present.  We will see the patient in follow-up in 6 months time she is to contact us prior to that time should any new difficulties arise.      Gailen Shelter, MD Advanced Bronchoscopy PCCM [facility]    *This note was dictated using voice recognition software/Dragon.  Despite best efforts to proofread, errors can occur which can change the meaning. Any transcriptional errors that result from this process are unintentional and may not be fully corrected at the time of dictation.

## 2023-06-29 NOTE — Patient Instructions (Addendum)
We are going to get a chest x-ray today.  You can get this done at the [facility].  We will call you with the results when these are available.  Your weight loss may be related to the methylphenidate (Ritalin) these types of medications can make you burn calories higher than usual.  Continue using your Symbicort as you are doing.  Continue as needed Atrovent.  We will see you in follow-up in 6 months time, call sooner should any problems arise.

## 2023-06-30 ENCOUNTER — Encounter: Payer: Self-pay | Admitting: Pulmonary Disease

## 2023-07-14 ENCOUNTER — Encounter: Payer: Self-pay | Admitting: Pulmonary Disease

## 2023-08-14 ENCOUNTER — Telehealth: Payer: Self-pay | Admitting: Pulmonary Disease

## 2023-08-14 DIAGNOSIS — R051 Acute cough: Secondary | ICD-10-CM

## 2023-08-14 NOTE — Telephone Encounter (Signed)
Called and spoke to patient. She stated that her son was sick last week and she now feels that she is developing bronchitis.  C/o prod cough with brown sputum, voice hoariness, increased SOB, back discomfort/cramping during coughing spells, temp of 100, chills and sweats x5d She is using albuterol BID and Atrovent BID without Improvement.  She does not have a nebulizer machine, as she has misplaced it.  No recent covid test.  Dr. Jayme Cloud, please advise. Thanks

## 2023-08-14 NOTE — Telephone Encounter (Signed)
Best if she gets seen at urgent care so that they can do imaging and COVID/influenza testing.

## 2023-08-14 NOTE — Telephone Encounter (Signed)
Pt feels she may be coming down with bronchitis. I have her scheduled in slot on Wednesday but she feels might need to see someone sooner

## 2023-08-14 NOTE — Telephone Encounter (Signed)
I have notified the patient. She said she does not know if she is up to going to Urgent Care today, but maybe tomorrow. She did schedule an appt for Wednesday. If she is feeling better she will cancel if she is not she will be seen then.  Nothing further needed.

## 2023-08-16 ENCOUNTER — Ambulatory Visit: Payer: Managed Care, Other (non HMO) | Admitting: Pulmonary Disease

## 2023-08-16 NOTE — Telephone Encounter (Signed)
Patient states was seen by primary care doctor. Would like to know if she needs appointment with Dr. Jayme Cloud. If needed would like mychart video visit. Patient phone number is [phone].

## 2023-08-16 NOTE — Telephone Encounter (Signed)
I spoke with the patient. She said her PCP yesterday. They are treating her for pneumonia. She is on 2 antibiotics and prednisone. She is feeling a little better today.  Per Dr. Jayme Cloud, we can schedule her an appt for next week with a chest xray before.   I notified the patient and scheduled her an appt for 11/6.  Nothing further needed.

## 2023-08-23 ENCOUNTER — Encounter: Payer: Self-pay | Admitting: Pulmonary Disease

## 2023-08-23 ENCOUNTER — Ambulatory Visit (INDEPENDENT_AMBULATORY_CARE_PROVIDER_SITE_OTHER): Payer: Managed Care, Other (non HMO) | Admitting: Pulmonary Disease

## 2023-08-23 VITALS — BP 128/84 | HR 96 | Temp 97.8°F | Ht 65.0 in | Wt 105.0 lb

## 2023-08-23 DIAGNOSIS — H6591 Unspecified nonsuppurative otitis media, right ear: Secondary | ICD-10-CM

## 2023-08-23 DIAGNOSIS — J4540 Moderate persistent asthma, uncomplicated: Secondary | ICD-10-CM | POA: Diagnosis not present

## 2023-08-23 DIAGNOSIS — R051 Acute cough: Secondary | ICD-10-CM | POA: Diagnosis not present

## 2023-08-23 LAB — NITRIC OXIDE: Nitric Oxide: 11

## 2023-08-23 MED ORDER — METHYLPREDNISOLONE 4 MG PO TBPK: ORAL_TABLET | ORAL | 0 refills | Status: DC

## 2023-08-23 NOTE — Progress Notes (Signed)
Subjective:    Patient ID: Susan Payne, female    DOB: 1981/03/24, 42 y.o.   MRN: [id number]  Patient Care Team: Chauncey Fischer, MD as PCP - General (Family Medicine)  Chief Complaint  Patient presents with   Follow-up    No SOB or wheezing. Cough with yellow sputum.     BACKGROUND/INTERVAL:Patient is a 42 year old  remote former smoker who follows here for the issue of moderate persistent asthma previously classified as severe. Last visit here was on 29 June 2023.  Previously the patient had been on Xolair however she discontinued this on her own.  She underwent significant weight loss and has noted marked improvement in her asthma control.  Since her prior visit she had to be seen in urgent care 29 October due to fever but some hoarseness.  The patient also had chest congestion at the time.  She was treated for asthma exacerbation and bronchitis versus pneumonitis.  Given a prednisone taper and placed on amoxicillin and azithromycin.  She follows up today after that urgent care visit.  HPI Discussed the use of AI scribe software for clinical note transcription with the patient, who gave verbal consent to proceed.  History of Present Illness   The patient, with a history of asthma, presented treated for a severe flare-up on 29 October through Urgent Care. She experienced significant difficulty breathing, describing a sensation of fluid in her chest that she was unable to expectorate. Accompanying these symptoms were lightheadedness and shortness of breath. The patient's sputum was initially brown, and she also had a fever. Despite an initial concern for pneumonia, the final diagnosis was bronchitis.  The patient's husband is a smoker, which may contribute to her respiratory symptoms. The patient has been using Symbicort twice daily and albuterol a few times a day, more than her usual usage. The patient's sputum has changed from brown to yellow, indicating a shift from infection to  inflammation. She completed a course of antibiotics and prednisone, the latter of which was not tapered and led to the patient feeling unwell for a few days post-treatment.  In addition to her respiratory symptoms, the patient also reported pressure in her ear. She has been using azelastine nasal spray at bedtime and was advised to add Claritin to her regimen. The patient also had a sinus infection earlier in the month, which may have contributed to her current symptoms. She has been taking Mucinex to help manage her symptoms.     Review of Systems A 10 point review of systems was performed and it is as noted above otherwise negative.   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   Vocal cord dysfunction 09/16/2019   Environmental allergies 09/16/2019   Depression 06/25/2019   GERD (gastroesophageal reflux disease) 06/25/2019   Mild persistent asthma 05/10/2016   Allergic rhinitis 04/09/2014   Healthcare maintenance 08/24/2013   Anxiety 04/24/2013   Asthma without status asthmaticus 02/25/2013   Insomnia 02/25/2013    Social History   Tobacco Use   Smoking status: Former    Current packs/day: 0.00    Average packs/day: 1 pack/day for 18.0 years (18.0 ttl pk-yrs)    Types: Cigarettes    Start date: 69    Quit date: 2016    Years since quitting: 8.8   Smokeless tobacco: Never  Substance Use Topics   Alcohol use: Yes  Comment: occasional     Allergies  Allergen Reactions   Cat Hair Extract Shortness Of Breath   Cedar Shortness Of Breath   Dog Epithelium (Canis Lupus Familiaris) Shortness Of Breath   Elder [Sambucus Nigra] Shortness Of Breath   Gadolinium Hives and Shortness Of Breath    Noted after MRI with contrast.   Gadolinium Derivatives Hives and Shortness Of Breath    Noted after MRI with contrast.   Molds & Smuts Shortness Of Breath   Sulfa  Antibiotics Nausea And Vomiting    Current Meds  Medication Sig   albuterol (VENTOLIN HFA) 108 (90 Base) MCG/ACT inhaler INHALE 2 PUFFS INTO THE LUNGS EVERY 4 HOURS AS NEEDED FOR WHEEZING OR SHORTNESS OF BREATH.   azelastine (ASTELIN) 0.1 % nasal spray Place 2 sprays into both nostrils 2 (two) times daily. Use in each nostril as directed   budesonide (PULMICORT) 0.5 MG/2ML nebulizer solution 0.5 mg.   busPIRone (BUSPAR) 10 MG tablet Take 10 mg by mouth as needed.   citalopram (CELEXA) 10 MG tablet Take 10 mg by mouth daily.   cyclobenzaprine (FLEXERIL) 5 MG tablet Take 5 mg by mouth as needed.   fluticasone (FLONASE) 50 MCG/ACT nasal spray Place 1 spray into both nostrils daily.   ipratropium (ATROVENT HFA) 17 MCG/ACT inhaler INHALE 2 PUFFS INTO THE LUNGS EVERY 6 HOURS AS NEEDED FOR WHEEZING   ipratropium (ATROVENT) 0.02 % nebulizer solution Take 2.5 mLs (0.5 mg total) by nebulization as needed for wheezing or shortness of breath.   LORazepam (ATIVAN) 0.5 MG tablet SMARTSIG:0.5-1 Tablet(s) By Mouth 1 to 2 Times Daily   methylphenidate (METADATE CD) 20 MG CR capsule Take 20 mg by mouth daily in the afternoon.   omeprazole (PRILOSEC) 20 MG capsule Take 20 mg by mouth daily.   SUMAtriptan (IMITREX) 100 MG tablet Take 100 mg by mouth as needed.   SYMBICORT 160-4.5 MCG/ACT inhaler INHALE 2 PUFFS INTO THE LUNGS TWICE DAILY   Tiotropium Bromide Monohydrate (SPIRIVA RESPIMAT) 2.5 MCG/ACT AERS Inhale 2 puffs into the lungs daily.   Vitamin D, Cholecalciferol, 25 MCG (1000 UT) CAPS Take 1 capsule by mouth daily.    Immunization History  Administered Date(s) Administered   Influenza, Seasonal, Injecte, Preservative Fre 08/23/2013   Influenza,inj,Quad PF,6+ Mos 07/01/2014, 07/19/2017, 10/15/2019, 07/17/2020, 08/16/2021   Influenza-Unspecified 07/01/2014   Moderna SARS-COV2 Booster Vaccination 09/23/2020   Moderna Sars-Covid-2 Vaccination 12/18/2019, 01/14/2020   Pneumococcal Conjugate-13 06/11/2021    Pneumococcal Polysaccharide-23 04/24/2013   Tdap 05/13/2016      Objective:   BP 128/84 (BP Location: Right Arm, Cuff Size: Normal)   Pulse 96   Temp 97.8 F (36.6 C)   Ht 5\' 5"  (1.651 m)   Wt 105 lb (47.6 kg)   SpO2 97%   BMI 17.47 kg/m   SpO2: 97 % O2 Device: None (Room air)  GENERAL: Well-developed, thin woman, no acute distress.  Fully ambulatory,no conversational dyspnea. HEAD: Normocephalic, atraumatic.  EYES: Pupils equal, round, reactive to light.  No scleral icterus.  MOUTH: Dentition intact.  Oral mucosa moist.  No thrush. NECK: Supple. No thyromegaly. Trachea midline. No JVD.  No adenopathy. PULMONARY: Good air entry bilaterally.  No adventitious sounds. CARDIOVASCULAR: S1 and S2. Regular rate and rhythm.  No rubs, murmurs or gallops heard. ABDOMEN: Benign. MUSCULOSKELETAL: No joint deformity, no clubbing, no edema.  NEUROLOGIC: No focal deficits.  Fully ambulatory.  Speech is fluent. SKIN: Intact,warm,dry.  Limited exam shows no rashes. PSYCH: Mood and behavior normal.  Lab Results  Component Value Date   NITRICOXIDE 11 08/23/2023    Assessment & Plan:     ICD-10-CM   1. Right serous otitis media, unspecified chronicity  H65.91     2. Moderate persistent asthma without complication  J45.40 Nitric oxide   Status post recent exacerbation     Orders Placed This Encounter  Procedures   Nitric oxide   Meds ordered this encounter  Medications   methylPREDNISolone (MEDROL DOSEPAK) 4 MG TBPK tablet    Sig: As directed in the package.    Dispense:  21 tablet    Refill:  0   Assessment and Plan    Asthma exacerbation Recent episode of severe shortness of breath, chest congestion, and discolored sputum. Initial concern for pneumonia, but ultimately diagnosed with bronchitis. Currently on Symbicort twice daily and Albuterol as needed. Recent course of antibiotics and prednisone (40mg  for 3 days) completed. Sputum color improving (from brown to yellow),  indicating resolution of infection and ongoing inflammation. -Continue Symbicort twice daily. -Continue Albuterol as needed. -Checked FeNO, normal. -Provide Medrol Dosepak for patient to have on hand in case of future exacerbations.  Serous Otitis Complaint of ear pressure. Likely secondary to recent sinus infection and ongoing allergic inflammation. -Recommend adding Claritin to current regimen of azelastine nasal spray. -Continue Mucinex.  Follow-up in 4-6 weeks to ensure resolution of current symptoms.     Gailen Shelter, MD [facility]    *This note was generated using voice recognition software/Dragon and/or AI transcription program.  Despite best efforts to proofread, errors can occur which can change the meaning. Any transcriptional errors that result from this process are unintentional and may not be fully corrected at the time of dictation.

## 2023-08-23 NOTE — Patient Instructions (Signed)
VISIT SUMMARY:  You presented with a severe asthma flare-up, experiencing significant difficulty breathing, lightheadedness, and shortness of breath. Initially, there was a concern for pneumonia, but the final diagnosis was bronchitis. You also reported ear pressure, likely due to a recent sinus infection and ongoing allergic inflammation.  YOUR PLAN:  -ASTHMA EXACERBATION: An asthma exacerbation is a worsening of asthma symptoms, such as shortness of breath and chest congestion. You were initially concerned about pneumonia, but it was diagnosed as bronchitis. Continue using Symbicort twice daily and Albuterol as needed. We will check the inflammation level in your airway. You will also have a Medrol Dosepak on hand for future exacerbations.  -SEROUS OTITIS: Serous otitis is the presence of fluid in the ear, often due to infections or allergies. You are experiencing ear pressure likely due to a recent sinus infection and ongoing allergic inflammation. Continue using azelastine nasal spray and add Claritin to your regimen. Continue taking Mucinex to help manage your symptoms.  INSTRUCTIONS:  Please follow up in 4-6 weeks to ensure the resolution of your current symptoms.

## 2023-08-24 ENCOUNTER — Other Ambulatory Visit: Payer: Managed Care, Other (non HMO)

## 2023-09-06 ENCOUNTER — Other Ambulatory Visit: Payer: Managed Care, Other (non HMO)

## 2023-09-08 ENCOUNTER — Other Ambulatory Visit: Payer: Self-pay | Admitting: Pulmonary Disease

## 2023-09-08 NOTE — Telephone Encounter (Signed)
Dr. Jayme Cloud, please advise if okay to refill. Patient has ventolin and accuneb listed as well.

## 2023-09-08 NOTE — Telephone Encounter (Signed)
Ok to fill, she alternates meds

## 2023-09-16 ENCOUNTER — Encounter: Payer: Self-pay | Admitting: Pulmonary Disease

## 2023-09-18 NOTE — Telephone Encounter (Signed)
So I can either give her Spiriva or Atrovent.  She had Atrovent filled recently and she could use that if needed be.  If she prefers the Spiriva then we have to discontinue the Atrovent.  The duplication of medication.

## 2023-09-18 NOTE — Telephone Encounter (Signed)
Okay to refill Spiriva? It was not mentioned in your last office note.

## 2023-09-19 ENCOUNTER — Other Ambulatory Visit: Payer: Managed Care, Other (non HMO)

## 2023-09-19 MED ORDER — SPIRIVA RESPIMAT 2.5 MCG/ACT IN AERS: 2.0000 | INHALATION_SPRAY | Freq: Every day | RESPIRATORY_TRACT | 3 refills | Status: AC

## 2023-09-19 NOTE — Telephone Encounter (Signed)
Okay to refill Spiriva.  She must not use Atrovent and Spiriva at the same time.  Use 1 or the other.

## 2023-09-26 ENCOUNTER — Encounter: Payer: Self-pay | Admitting: Pulmonary Disease

## 2023-09-26 MED ORDER — SYMBICORT 160-4.5 MCG/ACT IN AERO: INHALATION_SPRAY | RESPIRATORY_TRACT | 3 refills | Status: DC

## 2023-09-28 ENCOUNTER — Other Ambulatory Visit: Payer: Managed Care, Other (non HMO) | Attending: Oncology

## 2023-09-28 ENCOUNTER — Ambulatory Visit: Payer: Managed Care, Other (non HMO) | Admitting: Nurse Practitioner

## 2023-09-28 ENCOUNTER — Encounter: Payer: Self-pay | Admitting: Nurse Practitioner

## 2023-09-28 VITALS — BP 120/66 | HR 83 | Temp 97.8°F | Ht 65.0 in | Wt 112.0 lb

## 2023-09-28 DIAGNOSIS — R0602 Shortness of breath: Secondary | ICD-10-CM | POA: Diagnosis not present

## 2023-09-28 DIAGNOSIS — J4540 Moderate persistent asthma, uncomplicated: Secondary | ICD-10-CM | POA: Diagnosis not present

## 2023-09-28 DIAGNOSIS — R058 Other specified cough: Secondary | ICD-10-CM

## 2023-09-28 LAB — NITRIC OXIDE: Nitric Oxide: 8

## 2023-09-28 MED ORDER — BENZONATATE 200 MG PO CAPS: 200.0000 mg | ORAL_CAPSULE | Freq: Three times a day (TID) | ORAL | 1 refills | Status: AC | PRN

## 2023-09-28 MED ORDER — PROMETHAZINE-DM 6.25-15 MG/5ML PO SYRP: 5.0000 mL | ORAL_SOLUTION | Freq: Four times a day (QID) | ORAL | 0 refills | Status: DC | PRN

## 2023-09-28 NOTE — Assessment & Plan Note (Signed)
Mild exacerbation secondary to viral illness and environmental triggers. Appears to be clinically improving. FeNO nl and no bronchospasm on exam. Advised to complete medrol dosepak and monitor symptoms. Action plan discussed. Continue current maintenance regimen.

## 2023-09-28 NOTE — Progress Notes (Signed)
@Patient  ID: Susan Payne, female    DOB: June 27, 1981, 42 y.o.   MRN: [id number]  Chief Complaint  Patient presents with   Follow-up    Cough, shortness of breath and wheezing.     Referring provider: Chauncey Fischer, MD  HPI: 42 year old female, never smoker followed for severe persistent asthma and allergic rhinitis. She is a patient of Dr. Jayme Cloud and last seen in office on 08/23/2023. Past medical history significant for GERD, anxiety, depression, POTS, previous sinus surgery.    TESTS/EVENTS:  08/14/2019 PFT: FVC 120, FEV1 107, ratio 74, TLC 122, DLCO 77.  No BD 06/29/2023 CXR: No acute process  08/23/2023: OV with Dr. Jayme Cloud.  Last visit in September.  Had previously been on Xolair but discontinued this on her own.  Underwent significant weight loss and noted marked improvement in her asthma control.  Since prior visit was seen at urgent care October 29 due to fever and some hoarseness.  Also had chest congestion.  She was treated for asthma exacerbation and bronchitis versus pneumonia.  She was given prednisone taper and placed on amoxicillin and azithromycin.  Follows up today.  Sputum was initially brown and had a fever.  Final diagnosis was bronchitis.  Husband is a smoker which may contribute to respiratory symptoms.  Has been using Symbicort twice daily and albuterol a few times a day, more so than usual.  Sputum has changed from brown to yellow.  Completed a course of antibiotics and prednisone.  Prednisone was not tapered which led to her feeling unwell for a few days after treatment.  Also having some pressure in her years.  Has been using Astelin nasal spray and advised to add Claritin on.  Has been covered appropriately from antimicrobial standpoint. FeNO normal.  Provided with Medrol Dosepak to have on hand in case of future exacerbations.   09/28/2023: Today - follow up Patient presents today for intended follow-up.  She was doing better after her last visit but then they  went on vacation for Thanksgiving.  The laundry detergent at an [employer] was very strong and she slept in the beds for 5 days.  She noticed that she started having a little more chest tightness after this.  She then then came home and got the christmas stuff down and dust bothered her. She felt like she was getting better with just some increased use of albuterol and Mucinex but then got another viral illness last Thursday. She had a lot of chest congestion and cough.  Her husband had similar symptoms.  Did not complete any viral testing.  She started the medrol dosepak over the weekend and has 1 day left.  Does seem to be helping.  She did get up some brown phlegm up once but mostly a dry cough now. Chest just feels irritated and so does her throat.  She has not had any more fevers.  No chills, hemoptysis, orthopnea, lower extremity swelling.  Not having any significant sinus symptoms.  Her ear pressure has pretty much resolved since her last visit and feels much better.  Using albuterol a few times a day.  She is currently doing her Symbicort twice a day and her Spiriva 2 puffs once a day.  Taking Mucinex.  Has not tried any over-the-counter cough syrups.  FeNO 8 ppb   Allergies  Allergen Reactions   Cat Hair Extract Shortness Of Breath   Cedar Shortness Of Breath   Dog Epithelium (Canis Lupus Familiaris) Shortness Of Breath  Elder [Sambucus Nigra] Shortness Of Breath   Gadolinium Hives and Shortness Of Breath    Noted after MRI with contrast.   Gadolinium Derivatives Hives and Shortness Of Breath    Noted after MRI with contrast.   Molds & Smuts Shortness Of Breath   Sulfa Antibiotics Nausea And Vomiting    Immunization History  Administered Date(s) Administered   Influenza, Seasonal, Injecte, Preservative Fre 08/23/2013   Influenza,inj,Quad PF,6+ Mos 07/01/2014, 07/19/2017, 10/15/2019, 07/17/2020, 08/16/2021   Influenza-Unspecified 07/01/2014   Moderna SARS-COV2 Booster Vaccination  09/23/2020   Moderna Sars-Covid-2 Vaccination 12/18/2019, 01/14/2020   Pneumococcal Conjugate-13 06/11/2021   Pneumococcal Polysaccharide-23 04/24/2013   Tdap 05/13/2016    Past Medical History:  Diagnosis Date   Anxiety    Asthma    COVID-19 04/19/2021   Fever, body aches.  Resolved   GERD (gastroesophageal reflux disease)    Motion sickness    long car rides   Reflux esophagitis    Wears contact lenses     Tobacco History: Social History   Tobacco Use  Smoking Status Former   Current packs/day: 0.00   Average packs/day: 1 pack/day for 18.0 years (18.0 ttl pk-yrs)   Types: Cigarettes   Start date: 67   Quit date: 2016   Years since quitting: 8.9  Smokeless Tobacco Never   Counseling given: Not Answered   Outpatient Medications Prior to Visit  Medication Sig Dispense Refill   albuterol (VENTOLIN HFA) 108 (90 Base) MCG/ACT inhaler INHALE 2 PUFFS INTO THE LUNGS EVERY 4 HOURS AS NEEDED FOR WHEEZING OR SHORTNESS OF BREATH. 18 g 3   azelastine (ASTELIN) 0.1 % nasal spray Place 2 sprays into both nostrils 2 (two) times daily. Use in each nostril as directed 60 mL 2   budesonide (PULMICORT) 0.5 MG/2ML nebulizer solution 0.5 mg.     busPIRone (BUSPAR) 10 MG tablet Take 10 mg by mouth as needed.     citalopram (CELEXA) 10 MG tablet Take 15 mg by mouth daily.     cyclobenzaprine (FLEXERIL) 5 MG tablet Take 5 mg by mouth as needed.     fluticasone (FLONASE) 50 MCG/ACT nasal spray Place 1 spray into both nostrils daily.     LORazepam (ATIVAN) 0.5 MG tablet SMARTSIG:0.5-1 Tablet(s) By Mouth 1 to 2 Times Daily     methylphenidate (METADATE CD) 20 MG CR capsule Take 20 mg by mouth daily in the afternoon.     methylPREDNISolone (MEDROL DOSEPAK) 4 MG TBPK tablet As directed in the package. 21 tablet 0   omeprazole (PRILOSEC) 20 MG capsule Take 20 mg by mouth daily.     SUMAtriptan (IMITREX) 100 MG tablet Take 100 mg by mouth as needed.     SYMBICORT 160-4.5 MCG/ACT inhaler INHALE 2  PUFFS INTO THE LUNGS TWICE DAILY 3 each 3   Tiotropium Bromide Monohydrate (SPIRIVA RESPIMAT) 2.5 MCG/ACT AERS Inhale 2 puffs into the lungs daily. 12 g 3   Vitamin D, Cholecalciferol, 25 MCG (1000 UT) CAPS Take 1 capsule by mouth daily.     ipratropium (ATROVENT) 0.02 % nebulizer solution Take 2.5 mLs (0.5 mg total) by nebulization as needed for wheezing or shortness of breath. (Patient not taking: Reported on 09/28/2023) 375 mL 2   albuterol (ACCUNEB) 0.63 MG/3ML nebulizer solution Take 1 ampule by nebulization every 6 (six) hours as needed for wheezing. (Patient not taking: Reported on 06/09/2022)     ATROVENT HFA 17 MCG/ACT inhaler INHALE 2 PUFFS INTO THE LUNGS EVERY 6 HOURS AS NEEDED FOR  WHEEZING 12.9 g 5   EPINEPHrine 0.3 mg/0.3 mL IJ SOAJ injection  (Patient not taking: Reported on 09/28/2023)     sertraline (ZOLOFT) 50 MG tablet Take 50 mg by mouth daily. (Patient not taking: Reported on 09/28/2023)     No facility-administered medications prior to visit.     Review of Systems:   Constitutional: No weight loss or gain, night sweats, fevers, chills, fatigue, or lassitude. HEENT: No headaches, difficulty swallowing, tooth/dental problems, or sore throat. No sneezing, itching, ear ache, nasal congestion, or post nasal drip. +throat irritation  CV:  No chest pain, orthopnea, PND, swelling in lower extremities, anasarca, dizziness, palpitations, syncope Resp: +dry cough; shortness of breath with exertion. No excess mucus or change in color of mucus. No hemoptysis. No wheezing.  No chest wall deformity GI:  No heartburn, indigestion, abdominal pain, nausea, vomiting, diarrhea, change in bowel habits, loss of appetite, bloody stools.  GU: No dysuria, change in color of urine, urgency or frequency.  No flank pain, no hematuria  Skin: No rash, lesions, ulcerations MSK:  No joint pain or swelling.  Neuro: No dizziness or lightheadedness.  Psych: No depression or anxiety. Mood stable.      Physical Exam:  BP 120/66 (BP Location: Left Arm, Patient Position: Sitting, Cuff Size: Normal)   Pulse 83   Temp 97.8 F (36.6 C) (Temporal)   Ht 5\' 5"  (1.651 m)   Wt 112 lb (50.8 kg)   SpO2 98%   BMI 18.64 kg/m   GEN: Pleasant, interactive, well-appearing; in no acute distress HEENT:  Normocephalic and atraumatic. PERRLA. Sclera white. Nasal turbinates pink, moist and patent bilaterally. No rhinorrhea present. Oropharynx erythematous and moist, without exudate or edema. No lesions, ulcerations, or postnasal drip.  NECK:  Supple w/ fair ROM. No JVD present. Normal carotid impulses w/o bruits. Thyroid symmetrical with no goiter or nodules palpated. No lymphadenopathy.   CV: RRR, no m/r/g, no peripheral edema. Pulses intact, +2 bilaterally. No cyanosis, pallor or clubbing. PULMONARY:  Unlabored, regular breathing. Clear bilaterally A&P w/o wheezes/rales/rhonchi. No accessory muscle use.  GI: BS present and normoactive. Soft, non-tender to palpation. No organomegaly or masses detected.  MSK: No erythema, warmth or tenderness. Cap refil <2 sec all extrem. No deformities or joint swelling noted.  Neuro: A/Ox3. No focal deficits noted.   Skin: Warm, no lesions or rashe Psych: Normal affect and behavior. Judgement and thought content appropriate.     Lab Results:  CBC No results found for: "WBC", "RBC", "HGB", "HCT", "PLT", "MCV", "MCH", "MCHC", "RDW", "LYMPHSABS", "MONOABS", "EOSABS", "BASOSABS"  BMET    Component Value Date/Time   NA 139 06/20/2019 1512   K 4.1 06/20/2019 1512   CL 104 06/20/2019 1512   CO2 25 06/20/2019 1512   GLUCOSE 126 (H) 06/20/2019 1512   BUN 18 06/20/2019 1512   CREATININE 0.73 06/20/2019 1512   CALCIUM 9.3 06/20/2019 1512   GFRNONAA >60 06/20/2019 1512   GFRAA >60 06/20/2019 1512    BNP No results found for: "BNP"   Imaging:  No results found.  Administration History     None           No data to display          Lab  Results  Component Value Date   NITRICOXIDE 8 09/28/2023        Assessment & Plan:   Post-viral cough syndrome Likely postviral cough with possible mild exacerbation of her asthma, which seems to be improved with Medrol  Dosepak.  Exhaled nitric oxide testing today was normal.  She does have some upper airway irritation, which is also contributing to her cough.  Will optimize cough control measures and work on minimizing further irritation to her upper airway.  Hold off on further steroids or antibiotics at this time.  If no improvement, recommend further evaluation with repeat imaging. Strict return precautions.  Patient Instructions  Continue Albuterol inhaler 2 puffs or 3 mL neb every 6 hours as needed for shortness of breath or wheezing. Notify if symptoms persist despite rescue inhaler/neb use.  Continue budesonide neb 2 mL Twice daily as needed for shortness of breath or wheezing. Brush tongue and rinse mouth afterwards Continue astelin nasal spray 2 sprays each nostril Twice daily  Continue flonase 1 spray each nostril daily Continue Symbicort 2 puffs Twice daily. Brush tongue and rinse mouth afterwards Continue Spiriva 2 puffs daily Continue daily allergy pill  Promethazine DM cough syrup 5 mL every 6 hours as needed for cough. May cause drowsiness. Do not drive after taking. Benzonatate 1 capsule Three times a day for cough  Complete medrol dosepak  Upper airway cough syndrome: Suppress your cough to allow your larynx (voice box) to heal.  Limit talking for the next few days. Avoid throat clearing. Work on cough suppression with the above recommended suppressants.  Use sugar free hard candies or non-menthol cough drops during this time to soothe your throat.  Warm tea with honey and lemon.   Follow up in 3 months with Dr. Jayme Cloud or Katie Kaylynn Chamblin,NP. If symptoms do not improve or worsen, please contact office for sooner follow up or seek emergency care.    Moderate persistent  asthma Mild exacerbation secondary to viral illness and environmental triggers. Appears to be clinically improving. FeNO nl and no bronchospasm on exam. Advised to complete medrol dosepak and monitor symptoms. Action plan discussed. Continue current maintenance regimen.   Advised if symptoms do not improve or worsen, to please contact office for sooner follow up or seek emergency care.   I spent 35 minutes of dedicated to the care of this patient on the date of this encounter to include pre-visit review of records, face-to-face time with the patient discussing conditions above, post visit ordering of testing, clinical documentation with the electronic health record, making appropriate referrals as documented, and communicating necessary findings to members of the patients care team.  Noemi Chapel, NP 09/28/2023  Pt aware and understands NP's role.

## 2023-09-28 NOTE — Assessment & Plan Note (Addendum)
Likely postviral cough with possible mild exacerbation of her asthma, which seems to be improved with Medrol Dosepak.  Exhaled nitric oxide testing today was normal.  She does have some upper airway irritation, which is also contributing to her cough.  Will optimize cough control measures and work on minimizing further irritation to her upper airway.  Hold off on further steroids or antibiotics at this time.  If no improvement, recommend further evaluation with repeat imaging. Strict return precautions.  Patient Instructions  Continue Albuterol inhaler 2 puffs or 3 mL neb every 6 hours as needed for shortness of breath or wheezing. Notify if symptoms persist despite rescue inhaler/neb use.  Continue budesonide neb 2 mL Twice daily as needed for shortness of breath or wheezing. Brush tongue and rinse mouth afterwards Continue astelin nasal spray 2 sprays each nostril Twice daily  Continue flonase 1 spray each nostril daily Continue Symbicort 2 puffs Twice daily. Brush tongue and rinse mouth afterwards Continue Spiriva 2 puffs daily Continue daily allergy pill  Promethazine DM cough syrup 5 mL every 6 hours as needed for cough. May cause drowsiness. Do not drive after taking. Benzonatate 1 capsule Three times a day for cough  Complete medrol dosepak  Upper airway cough syndrome: Suppress your cough to allow your larynx (voice box) to heal.  Limit talking for the next few days. Avoid throat clearing. Work on cough suppression with the above recommended suppressants.  Use sugar free hard candies or non-menthol cough drops during this time to soothe your throat.  Warm tea with honey and lemon.   Follow up in 3 months with Dr. Jayme Cloud or Katie Aliviana Burdell,NP. If symptoms do not improve or worsen, please contact office for sooner follow up or seek emergency care.

## 2023-09-28 NOTE — Patient Instructions (Addendum)
Continue Albuterol inhaler 2 puffs or 3 mL neb every 6 hours as needed for shortness of breath or wheezing. Notify if symptoms persist despite rescue inhaler/neb use.  Continue budesonide neb 2 mL Twice daily as needed for shortness of breath or wheezing. Brush tongue and rinse mouth afterwards Continue astelin nasal spray 2 sprays each nostril Twice daily  Continue flonase 1 spray each nostril daily Continue Symbicort 2 puffs Twice daily. Brush tongue and rinse mouth afterwards Continue Spiriva 2 puffs daily Continue daily allergy pill  Promethazine DM cough syrup 5 mL every 6 hours as needed for cough. May cause drowsiness. Do not drive after taking. Benzonatate 1 capsule Three times a day for cough  Complete medrol dosepak  Upper airway cough syndrome: Suppress your cough to allow your larynx (voice box) to heal.  Limit talking for the next few days. Avoid throat clearing. Work on cough suppression with the above recommended suppressants.  Use sugar free hard candies or non-menthol cough drops during this time to soothe your throat.  Warm tea with honey and lemon.   Follow up in 3 months with Dr. Jayme Cloud or Katie Shaydon Lease,NP. If symptoms do not improve or worsen, please contact office for sooner follow up or seek emergency care.

## 2023-09-29 NOTE — Progress Notes (Signed)
Agree with the details of the visit as noted by Katherine Cobb, NP. ° °C. Laura Yasin Ducat, MD °Advanced Bronchoscopy °PCCM [facility] ° °

## 2023-12-28 ENCOUNTER — Encounter: Payer: Self-pay | Admitting: Pulmonary Disease

## 2023-12-28 ENCOUNTER — Ambulatory Visit: Payer: Managed Care, Other (non HMO) | Admitting: Pulmonary Disease

## 2023-12-28 VITALS — BP 124/84 | HR 86 | Temp 97.1°F | Ht 65.0 in | Wt 109.6 lb

## 2023-12-28 DIAGNOSIS — D839 Common variable immunodeficiency, unspecified: Secondary | ICD-10-CM

## 2023-12-28 DIAGNOSIS — J309 Allergic rhinitis, unspecified: Secondary | ICD-10-CM | POA: Diagnosis not present

## 2023-12-28 DIAGNOSIS — J4540 Moderate persistent asthma, uncomplicated: Secondary | ICD-10-CM

## 2023-12-28 DIAGNOSIS — R0602 Shortness of breath: Secondary | ICD-10-CM

## 2023-12-28 LAB — NITRIC OXIDE: Nitric Oxide: 8

## 2023-12-28 NOTE — Progress Notes (Signed)
 Subjective:    Patient ID: Susan Payne, female    DOB: July 21, 1981, 43 y.o.   MRN: [id number]  Patient Care Team: Susan Fischer, MD as PCP - General (Family Medicine) Susan Chapel, NP as Nurse Practitioner (Nurse Practitioner)  Chief Complaint  Patient presents with   Follow-up    Had the flu 2 weeks ago. Will finish Augmentin today. Started Predisone yesterday. DOE. Little wheezing. Cough with white/yellow sputum.     BACKGROUND/INTERVAL: Patient is a 43 year old  remote former smoker who follows here for the issue of moderate persistent asthma previously classified as severe. Last visit with me was on 23 August 2023.  In the interim saw Susan Croft, NP on 28 September 2023.  HPI Discussed the use of AI scribe software for clinical note transcription with the patient, who gave verbal consent to proceed.  History of Present Illness   The patient, with persistent asthma, presents with an exacerbation of her condition. She is under the care of an allergist for allergy testing and management.  She has been experiencing symptoms consistent with the flu for the past two weeks, which have exacerbated her persistent asthma. She started prednisone yesterday due to significant shortness of breath and a sensation of 'busy breathing'. Initially, she hoped the symptoms would resolve without medication but decided to start treatment as her condition worsened. She is currently using Symbicort as part of her asthma management.  Allergy testing has revealed sensitivities to cats, dogs, and nettle. Further immunology testing is being conducted to evaluate for a specific antibody deficiency, as she frequently develops sinus infections, pneumonitis, or pneumonia following common colds. She is scheduled to receive a pneumonia vaccine next week as part of this evaluation.    Review of Systems A 10 point review of systems was performed and it is as noted above otherwise negative.   Patient Active  Problem List   Diagnosis Date Noted   Post-viral cough syndrome 09/28/2023   Asthma exacerbation 10/21/2021   CAP (community acquired pneumonia) 10/21/2021   Elevated IgE level 09/16/2019   Vocal cord dysfunction 09/16/2019   Environmental allergies 09/16/2019   Depression 06/25/2019   GERD (gastroesophageal reflux disease) 06/25/2019   Moderate persistent asthma 05/10/2016   Allergic rhinitis 04/09/2014   Healthcare maintenance 08/24/2013   Anxiety 04/24/2013   Asthma without status asthmaticus 02/25/2013   Insomnia 02/25/2013    Social History   Tobacco Use   Smoking status: Former    Current packs/day: 0.00    Average packs/day: 1 pack/day for 18.0 years (18.0 ttl pk-yrs)    Types: Cigarettes    Start date: 31    Quit date: 2016    Years since quitting: 9.2   Smokeless tobacco: Never  Substance Use Topics   Alcohol use: Yes    Comment: occasional     Allergies  Allergen Reactions   Cat Dander Shortness Of Breath   Cedar Shortness Of Breath   Dog Epithelium (Canis Lupus Familiaris) Shortness Of Breath   Elder [Sambucus Nigra] Shortness Of Breath   Gadolinium Hives and Shortness Of Breath    Noted after MRI with contrast.   Gadolinium Derivatives Hives and Shortness Of Breath    Noted after MRI with contrast.   Molds & Smuts Shortness Of Breath   Sulfa Antibiotics Nausea And Vomiting    Current Meds  Medication Sig   albuterol (VENTOLIN HFA) 108 (90 Base) MCG/ACT inhaler INHALE 2 PUFFS INTO THE LUNGS EVERY 4 HOURS AS NEEDED FOR  WHEEZING OR SHORTNESS OF BREATH.   azelastine (ASTELIN) 0.1 % nasal spray Place 2 sprays into both nostrils 2 (two) times daily. Use in each nostril as directed   benzonatate (TESSALON) 200 MG capsule Take 1 capsule (200 mg total) by mouth 3 (three) times daily as needed for cough.   budesonide (PULMICORT) 0.5 MG/2ML nebulizer solution 0.5 mg.   busPIRone (BUSPAR) 10 MG tablet Take 10 mg by mouth as needed.   citalopram (CELEXA) 10 MG  tablet Take 15 mg by mouth daily.   cyclobenzaprine (FLEXERIL) 5 MG tablet Take 5 mg by mouth as needed.   fluticasone (FLONASE) 50 MCG/ACT nasal spray Place 1 spray into both nostrils daily.   ipratropium (ATROVENT) 0.02 % nebulizer solution Take 2.5 mLs (0.5 mg total) by nebulization as needed for wheezing or shortness of breath.   LORazepam (ATIVAN) 0.5 MG tablet SMARTSIG:0.5-1 Tablet(s) By Mouth 1 to 2 Times Daily   methylphenidate (METADATE CD) 20 MG CR capsule Take 20 mg by mouth daily in the afternoon.   omeprazole (PRILOSEC) 20 MG capsule Take 20 mg by mouth daily.   promethazine-dextromethorphan (PROMETHAZINE-DM) 6.25-15 MG/5ML syrup Take 5 mLs by mouth 4 (four) times daily as needed for cough.   SUMAtriptan (IMITREX) 100 MG tablet Take 100 mg by mouth as needed.   SYMBICORT 160-4.5 MCG/ACT inhaler INHALE 2 PUFFS INTO THE LUNGS TWICE DAILY   Tiotropium Bromide Monohydrate (SPIRIVA RESPIMAT) 2.5 MCG/ACT AERS Inhale 2 puffs into the lungs daily.   Vitamin D, Cholecalciferol, 25 MCG (1000 UT) CAPS Take 1 capsule by mouth daily.   [DISCONTINUED] methylPREDNISolone (MEDROL DOSEPAK) 4 MG TBPK tablet As directed in the package.    Immunization History  Administered Date(s) Administered   Influenza, Seasonal, Injecte, Preservative Fre 08/23/2013   Influenza,inj,Quad PF,6+ Mos 07/01/2014, 07/19/2017, 10/15/2019, 07/17/2020, 08/16/2021   Influenza-Unspecified 07/01/2014   Moderna SARS-COV2 Booster Vaccination 09/23/2020   Moderna Sars-Covid-2 Vaccination 12/18/2019, 01/14/2020   Pneumococcal Conjugate-13 06/11/2021   Pneumococcal Polysaccharide-23 04/24/2013   Tdap 05/13/2016        Objective:     BP 124/84 (BP Location: Right Arm, Cuff Size: Normal)   Pulse 86   Temp (!) 97.1 F (36.2 C)   Ht 5\' 5"  (1.651 m)   Wt 109 lb 9.6 oz (49.7 kg)   SpO2 98%   BMI 18.24 kg/m   SpO2: 98 % O2 Device: None (Room air)  GENERAL: Well-developed, thin woman, no acute distress.  Fully  ambulatory,no conversational dyspnea. HEAD: Normocephalic, atraumatic.  EYES: Pupils equal, round, reactive to light.  No scleral icterus.  MOUTH: Dentition intact.  Oral mucosa moist.  No thrush. NECK: Supple. No thyromegaly. Trachea midline. No JVD.  No adenopathy. PULMONARY: Good air entry bilaterally.  No adventitious sounds. CARDIOVASCULAR: S1 and S2. Regular rate and rhythm.  No rubs, murmurs or gallops heard. ABDOMEN: Benign. MUSCULOSKELETAL: No joint deformity, no clubbing, no edema.  NEUROLOGIC: No focal deficits.  Fully ambulatory.  Speech is fluent. SKIN: Intact,warm,dry.  Limited exam shows no rashes. PSYCH: Mood and behavior normal.  Lab Results  Component Value Date   NITRICOXIDE 8 12/28/2023         Assessment & Plan:     ICD-10-CM   1. Moderate persistent asthma without complication  J45.40 Nitric oxide    2. 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 She started prednisone yesterday after experiencing increased dyspnea and tachypnea. Her symptoms have improved, and her lung examination is clear. The low nitric oxide level indicates effective inflammation reduction by prednisone. - Continue prednisone as prescribed - Continue Symbicort - Seek medical attention if flare-ups occur  Allergic Rhinitis Jazia is under allergist care for allergic rhinitis, with identified allergens including cats, dogs, and nettle. She is advised to perform sinus rinses twice daily and treat symptoms promptly to prevent complications. The allergist is considering a specific antibody deficiency as a contributing factor (CVID). - Perform sinus rinses twice  daily - Treat symptoms promptly to prevent complications - Follow-up with allergy and immunology  Common Variable Immune Deficiency (CVID) Natalia is being evaluated for CVID due to recurrent infections following viral illnesses. Her allergist is conducting immunology testing, including a pneumonia vaccine challenge, to confirm the diagnosis. If CVID is confirmed, treatment options include allergy shots or IVIG infusions, though insurance may require failure of other treatments first. Prophylactic antibiotics are a potential alternative, though not preferred due to systemic effects. - Complete pneumonia vaccine challenge - Follow up with allergist for immunology testing results - Consider allergy shots or other treatments based on test results    Advised if symptoms do not improve or worsen, to please contact office for sooner follow up or seek emergency care.    I spent 33 minutes of dedicated to the care of this patient on the date of this encounter to include pre-visit review of records, face-to-face time with the patient discussing conditions above, post visit ordering of testing, clinical documentation with the electronic health record, making appropriate referrals as documented, and communicating necessary findings to members of the patients care team.     C. Danice Goltz, MD Advanced Bronchoscopy PCCM [facility]    *This note was generated using voice recognition software/Dragon and/or AI transcription program.  Despite best efforts to proofread, errors can occur which can change the meaning. Any transcriptional errors that result from this process are unintentional and may not be fully corrected at the time of dictation.

## 2024-01-03 ENCOUNTER — Telehealth: Payer: Self-pay | Admitting: Pulmonary Disease

## 2024-01-03 DIAGNOSIS — J4541 Moderate persistent asthma with (acute) exacerbation: Secondary | ICD-10-CM

## 2024-01-03 MED ORDER — METHYLPREDNISOLONE 4 MG PO TBPK
ORAL_TABLET | ORAL | 0 refills | Status: DC
Start: 2024-01-03 — End: 2024-04-11

## 2024-01-03 NOTE — Telephone Encounter (Signed)
 I have notified the patient. Nothing further needed.

## 2024-01-03 NOTE — Telephone Encounter (Signed)
 Sent a Medrol dose pack to pharmacy

## 2024-01-03 NOTE — Telephone Encounter (Signed)
 PT completed a round of Pred Monday. Even w/inhalers she is still SOB. She is wondering if she can get more Pred to bring down inflammation.   [facility] in [place].

## 2024-01-15 ENCOUNTER — Encounter: Payer: Self-pay | Admitting: Pulmonary Disease

## 2024-04-11 ENCOUNTER — Other Ambulatory Visit
Admission: RE | Admit: 2024-04-11 | Discharge: 2024-04-11 | Disposition: A | Discharge status: Home / Self Care | Payer: Self-pay | Source: Ambulatory Visit | Attending: Oncology | Admitting: Oncology

## 2024-04-11 ENCOUNTER — Encounter: Payer: Self-pay | Admitting: Pulmonary Disease

## 2024-04-11 ENCOUNTER — Ambulatory Visit (INDEPENDENT_AMBULATORY_CARE_PROVIDER_SITE_OTHER): Admitting: Pulmonary Disease

## 2024-04-11 VITALS — BP 120/74 | HR 62 | Temp 98.3°F | Ht 65.0 in | Wt 116.6 lb

## 2024-04-11 DIAGNOSIS — J4540 Moderate persistent asthma, uncomplicated: Secondary | ICD-10-CM

## 2024-04-11 DIAGNOSIS — Z006 Encounter for examination for normal comparison and control in clinical research program: Secondary | ICD-10-CM | POA: Insufficient documentation

## 2024-04-11 DIAGNOSIS — J329 Chronic sinusitis, unspecified: Secondary | ICD-10-CM | POA: Diagnosis not present

## 2024-04-11 DIAGNOSIS — Z87891 Personal history of nicotine dependence: Secondary | ICD-10-CM | POA: Diagnosis not present

## 2024-04-11 MED ORDER — COMBIVENT RESPIMAT 20-100 MCG/ACT IN AERS
1.0000 | INHALATION_SPRAY | Freq: Four times a day (QID) | RESPIRATORY_TRACT | 6 refills | Status: DC | PRN
Start: 2024-04-11 — End: 2024-07-25

## 2024-04-11 NOTE — Patient Instructions (Addendum)
 VISIT SUMMARY:  Today, we discussed the management of your persistent asthma. You mentioned that your asthma has been stable without recent exacerbations, and you are currently using Symbicort  and Spiriva  for management. We also talked about the challenges you face in obtaining Atrovent  due to cost and your use of atenolol, which might affect your asthma medication's efficacy.  YOUR PLAN:  -PERSISTENT ASTHMA: Persistent asthma is a long-term condition where your airways are inflamed and narrowed, making it hard to breathe. Your asthma is well-managed with Symbicort .  Since you have difficulty obtaining Atrovent , we will prescribe Combivent as a rescue inhaler, which you can use up to four times a day if needed.  INSTRUCTIONS:  Please continue using Symbicort  as your maintenance therapy and Combivent as needed for symptom control. Use Combivent as your rescue inhaler, up to four times a day if necessary. Follow up in four months for a reassessment of your asthma management.

## 2024-04-11 NOTE — Progress Notes (Signed)
 Subjective:    Patient ID: Susan Payne, female    DOB: 1981/04/03, 43 y.o.   MRN: [id number]  Patient Care Team: Baird Harlene NOVAK, MD as PCP - General (Family Medicine) Malachy Comer GAILS, NP as Nurse Practitioner (Nurse Practitioner)  Chief Complaint  Patient presents with   Follow-up    BACKGROUND/INTERVAL:Patient is a 43 year old  remote former smoker who follows here for the issue of moderate persistent asthma previously classified as severe. Last visit with me was on 28 December 2023.  HPI Discussed the use of AI scribe software for clinical note transcription with the patient, who gave verbal consent to proceed.  History of Present Illness   Susan Payne is a 43 year old female with persistent asthma who presents for management of her condition.  She has persistent asthma and is currently using Symbicort  and Spiriva  for management. She has been unable to fill Atrovent  due to cost issues and has been using Spiriva  instead. She prefers Atrovent  for occasional use but has been managing with Spiriva  daily.  However she does prefer Atrovent  as rescue.   There have been no recent exacerbations, and her asthma has been stable. She is not currently smoking. She mentions taking atenolol, a beta blocker, for migraine prophylaxis, which might affect her asthma medication's efficacy. However, her asthma remains stable, although she feels that albuterol  does not work as well for her, and she rarely uses it.  In the past she had been on propranolol but was switched to atenolol due to cardio selectivity.  She notes that her asthma has been doing better on the atenolol.    She has not had any recent exacerbations.  No fevers, chills or sweats.  Weight has been stable.  No anorexia.  She continues to follow at [facility] allergy and immunology.  Overall she feels well and looks well     Review of Systems A 10 point review of systems was performed and it is as noted above otherwise negative.   Patient  Active Problem List   Diagnosis Date Noted   Post-viral cough syndrome 09/28/2023   Asthma exacerbation 10/21/2021   CAP (community acquired pneumonia) 10/21/2021   Elevated IgE level 09/16/2019   Vocal cord dysfunction 09/16/2019   Environmental allergies 09/16/2019   Depression 06/25/2019   GERD (gastroesophageal reflux disease) 06/25/2019   Moderate persistent asthma 05/10/2016   Allergic rhinitis 04/09/2014   Healthcare maintenance 08/24/2013   Anxiety 04/24/2013   Asthma without status asthmaticus 02/25/2013   Insomnia 02/25/2013    Social History   Tobacco Use   Smoking status: Former    Current packs/day: 0.00    Average packs/day: 1 pack/day for 18.0 years (18.0 ttl pk-yrs)    Types: Cigarettes    Start date: 61    Quit date: 2016    Years since quitting: 9.4   Smokeless tobacco: Never  Substance Use Topics   Alcohol use: Yes    Comment: occasional     Allergies  Allergen Reactions   Cat Dander Shortness Of Breath   Cedar Shortness Of Breath   Dog Epithelium (Canis Lupus Familiaris) Shortness Of Breath   Elder [Sambucus Nigra] Shortness Of Breath   Gadolinium Hives and Shortness Of Breath    Noted after MRI with contrast.   Gadolinium Derivatives Hives and Shortness Of Breath    Noted after MRI with contrast.   Molds & Smuts Shortness Of Breath   Sulfa Antibiotics Nausea And Vomiting    Current Meds  Medication  Sig   azelastine  (ASTELIN ) 0.1 % nasal spray Place 2 sprays into both nostrils 2 (two) times daily. Use in each nostril as directed   benzonatate  (TESSALON ) 200 MG capsule Take 1 capsule (200 mg total) by mouth 3 (three) times daily as needed for cough.   budesonide  (PULMICORT ) 0.5 MG/2ML nebulizer solution 0.5 mg.   busPIRone (BUSPAR) 10 MG tablet Take 10 mg by mouth as needed.   citalopram (CELEXA) 10 MG tablet Take 15 mg by mouth daily.   cyclobenzaprine  (FLEXERIL ) 5 MG tablet Take 5 mg by mouth as needed.   fluticasone  (FLONASE ) 50 MCG/ACT  nasal spray Place 1 spray into both nostrils daily.   Ipratropium-Albuterol  (COMBIVENT  RESPIMAT) 20-100 MCG/ACT AERS respimat Inhale 1 puff into the lungs every 6 (six) hours as needed for wheezing or shortness of breath.   LORazepam (ATIVAN) 0.5 MG tablet SMARTSIG:0.5-1 Tablet(s) By Mouth 1 to 2 Times Daily   methylphenidate (METADATE CD) 20 MG CR capsule Take 20 mg by mouth daily in the afternoon.   omeprazole (PRILOSEC) 20 MG capsule Take 20 mg by mouth daily.   SUMAtriptan (IMITREX) 100 MG tablet Take 100 mg by mouth as needed.   SYMBICORT  160-4.5 MCG/ACT inhaler INHALE 2 PUFFS INTO THE LUNGS TWICE DAILY   Tiotropium Bromide Monohydrate  (SPIRIVA  RESPIMAT) 2.5 MCG/ACT AERS Inhale 2 puffs into the lungs daily.   Vitamin D, Cholecalciferol, 25 MCG (1000 UT) CAPS Take 1 capsule by mouth daily.   [DISCONTINUED] albuterol  (VENTOLIN  HFA) 108 (90 Base) MCG/ACT inhaler INHALE 2 PUFFS INTO THE LUNGS EVERY 4 HOURS AS NEEDED FOR WHEEZING OR SHORTNESS OF BREATH.   [DISCONTINUED] ipratropium (ATROVENT ) 0.02 % nebulizer solution Take 2.5 mLs (0.5 mg total) by nebulization as needed for wheezing or shortness of breath.    Immunization History  Administered Date(s) Administered   Influenza, Seasonal, Injecte, Preservative Fre 08/23/2013   Influenza,inj,Quad PF,6+ Mos 07/01/2014, 07/19/2017, 10/15/2019, 07/17/2020, 08/16/2021   Influenza-Unspecified 07/01/2014   Moderna SARS-COV2 Booster Vaccination 09/23/2020   Moderna Sars-Covid-2 Vaccination 12/18/2019, 01/14/2020   Pneumococcal Conjugate-13 06/11/2021   Pneumococcal Polysaccharide-23 04/24/2013   Tdap 05/13/2016      Objective:     BP 120/74 (BP Location: Right Arm, Patient Position: Sitting, Cuff Size: Normal)   Pulse 62   Temp 98.3 F (36.8 C) (Oral)   Ht 5' 5 (1.651 m)   Wt 116 lb 9.6 oz (52.9 kg)   SpO2 100%   BMI 19.40 kg/m   SpO2: 100 %  GENERAL: Well-developed, thin woman, no acute distress.  Fully ambulatory,no conversational  dyspnea. HEAD: Normocephalic, atraumatic.  EYES: Pupils equal, round, reactive to light.  No scleral icterus.  MOUTH: Dentition intact.  Oral mucosa moist.  No thrush. NECK: Supple. No thyromegaly. Trachea midline. No JVD.  No adenopathy. PULMONARY: Good air entry bilaterally.  No adventitious sounds. CARDIOVASCULAR: S1 and S2. Regular rate and rhythm.  No rubs, murmurs or gallops heard. ABDOMEN: Benign. MUSCULOSKELETAL: No joint deformity, no clubbing, no edema.  NEUROLOGIC: No focal deficits.  Fully ambulatory.  Speech is fluent. SKIN: Intact,warm,dry.  Limited exam shows no rashes. PSYCH: Mood and behavior normal.        Assessment & Plan:     ICD-10-CM   1. 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    Dispense:  4 g    Refill:  6   Discussion:    Persistent asthma Persistent asthma is well-managed with Symbicort  and as needed Spiriva . She experiences difficulty obtaining Atrovent  due to cost. Atenolol, a selective beta-blocker, is used without significant impact on asthma control. Albuterol  is less effective, she feels Atrovent  is best rescue inhaler.  Discussed use of Combivent  given that it does have ipratropium and appears to be covered by her insurance company. - Continue Symbicort  as maintenance therapy. - Use Combivent  1 puff every 6 as needed for symptom control. - Follow up in four months.    Advised if symptoms do not improve or worsen, to please contact office for sooner follow up or seek emergency care.    I spent 32 minutes of dedicated to the care of this patient on the date of this encounter to include pre-visit review of records, face-to-face time with the patient discussing conditions  above, post visit ordering of testing, clinical documentation with the electronic health record, making appropriate referrals as documented, and communicating necessary findings to members of the patients care team.     C. Leita Sanders, MD Advanced Bronchoscopy PCCM [facility]    *This note was generated using voice recognition software/Dragon and/or AI transcription program.  Despite best efforts to proofread, errors can occur which can change the meaning. Any transcriptional errors that result from this process are unintentional and may not be fully corrected at the time of dictation.

## 2024-04-12 ENCOUNTER — Encounter: Payer: Self-pay | Admitting: Pulmonary Disease

## 2024-04-21 LAB — GENECONNECT MOLECULAR SCREEN: Genetic Analysis Overall Interpretation: NEGATIVE

## 2024-07-25 ENCOUNTER — Encounter: Payer: Self-pay | Admitting: Pulmonary Disease

## 2024-07-25 DIAGNOSIS — J4540 Moderate persistent asthma, uncomplicated: Secondary | ICD-10-CM

## 2024-07-25 MED ORDER — COMBIVENT RESPIMAT 20-100 MCG/ACT IN AERS: 1.0000 | INHALATION_SPRAY | Freq: Four times a day (QID) | RESPIRATORY_TRACT | 6 refills | Status: DC | PRN

## 2024-07-25 MED ORDER — COMBIVENT RESPIMAT 20-100 MCG/ACT IN AERS: 1.0000 | INHALATION_SPRAY | Freq: Four times a day (QID) | RESPIRATORY_TRACT | 6 refills | Status: AC | PRN

## 2024-08-13 ENCOUNTER — Ambulatory Visit: Admitting: Pulmonary Disease

## 2024-09-23 ENCOUNTER — Other Ambulatory Visit: Payer: Self-pay | Admitting: Pulmonary Disease
